# Patient Record
Sex: Female | Born: 1975 | Race: Black or African American | Hispanic: No | Marital: Single | Smoking: Never smoker
Health system: Southern US, Community
[De-identification: ages and names within clinical notes are randomized; demographics above are authoritative.]

## PROBLEM LIST (undated history)

## (undated) DIAGNOSIS — J45909 Unspecified asthma, uncomplicated: Secondary | ICD-10-CM

## (undated) DIAGNOSIS — E119 Type 2 diabetes mellitus without complications: Secondary | ICD-10-CM

## (undated) HISTORY — PX: GASTRIC BYPASS: SHX52

---

## 2007-02-10 HISTORY — PX: GASTRIC BYPASS: SHX52

## 2019-10-17 ENCOUNTER — Emergency Department (HOSPITAL_COMMUNITY): Payer: Medicaid - Out of State

## 2019-10-17 ENCOUNTER — Inpatient Hospital Stay (HOSPITAL_COMMUNITY)
Admission: EM | Admit: 2019-10-17 | Discharge: 2019-10-29 | DRG: 177 | Disposition: A | Discharge status: Home / Self Care | Payer: Medicaid - Out of State | Attending: Internal Medicine | Admitting: Internal Medicine

## 2019-10-17 ENCOUNTER — Encounter (HOSPITAL_COMMUNITY): Payer: Self-pay

## 2019-10-17 DIAGNOSIS — D259 Leiomyoma of uterus, unspecified: Secondary | ICD-10-CM | POA: Diagnosis present

## 2019-10-17 DIAGNOSIS — R52 Pain, unspecified: Secondary | ICD-10-CM

## 2019-10-17 DIAGNOSIS — D62 Acute posthemorrhagic anemia: Secondary | ICD-10-CM | POA: Diagnosis present

## 2019-10-17 DIAGNOSIS — J1282 Pneumonia due to coronavirus disease 2019: Secondary | ICD-10-CM | POA: Diagnosis present

## 2019-10-17 DIAGNOSIS — R6510 Systemic inflammatory response syndrome (SIRS) of non-infectious origin without acute organ dysfunction: Secondary | ICD-10-CM | POA: Diagnosis present

## 2019-10-17 DIAGNOSIS — L0291 Cutaneous abscess, unspecified: Secondary | ICD-10-CM

## 2019-10-17 DIAGNOSIS — Z9114 Patient's other noncompliance with medication regimen: Secondary | ICD-10-CM

## 2019-10-17 DIAGNOSIS — U071 COVID-19: Secondary | ICD-10-CM

## 2019-10-17 DIAGNOSIS — K255 Chronic or unspecified gastric ulcer with perforation: Secondary | ICD-10-CM

## 2019-10-17 DIAGNOSIS — D649 Anemia, unspecified: Secondary | ICD-10-CM | POA: Diagnosis present

## 2019-10-17 DIAGNOSIS — E872 Acidosis: Secondary | ICD-10-CM | POA: Diagnosis present

## 2019-10-17 DIAGNOSIS — K286 Chronic or unspecified gastrojejunal ulcer with both hemorrhage and perforation: Secondary | ICD-10-CM | POA: Diagnosis present

## 2019-10-17 DIAGNOSIS — E1165 Type 2 diabetes mellitus with hyperglycemia: Secondary | ICD-10-CM | POA: Diagnosis not present

## 2019-10-17 DIAGNOSIS — Z9884 Bariatric surgery status: Secondary | ICD-10-CM

## 2019-10-17 DIAGNOSIS — Y832 Surgical operation with anastomosis, bypass or graft as the cause of abnormal reaction of the patient, or of later complication, without mention of misadventure at the time of the procedure: Secondary | ICD-10-CM | POA: Diagnosis present

## 2019-10-17 DIAGNOSIS — K922 Gastrointestinal hemorrhage, unspecified: Secondary | ICD-10-CM

## 2019-10-17 DIAGNOSIS — F1010 Alcohol abuse, uncomplicated: Secondary | ICD-10-CM | POA: Diagnosis present

## 2019-10-17 DIAGNOSIS — R188 Other ascites: Secondary | ICD-10-CM | POA: Diagnosis present

## 2019-10-17 DIAGNOSIS — R739 Hyperglycemia, unspecified: Secondary | ICD-10-CM | POA: Diagnosis not present

## 2019-10-17 DIAGNOSIS — J90 Pleural effusion, not elsewhere classified: Secondary | ICD-10-CM | POA: Diagnosis present

## 2019-10-17 DIAGNOSIS — N179 Acute kidney failure, unspecified: Secondary | ICD-10-CM | POA: Diagnosis not present

## 2019-10-17 DIAGNOSIS — T8143XA Infection following a procedure, organ and space surgical site, initial encounter: Secondary | ICD-10-CM | POA: Diagnosis present

## 2019-10-17 DIAGNOSIS — R198 Other specified symptoms and signs involving the digestive system and abdomen: Secondary | ICD-10-CM | POA: Diagnosis present

## 2019-10-17 LAB — POC OCCULT BLOOD, ED: Fecal Occult Bld: POSITIVE — AB

## 2019-10-17 LAB — SARS CORONAVIRUS 2 BY RT PCR (HOSPITAL ORDER, PERFORMED IN ~~LOC~~ HOSPITAL LAB): SARS Coronavirus 2: POSITIVE — AB

## 2019-10-17 LAB — COMPREHENSIVE METABOLIC PANEL
ALT: 10 U/L (ref 0–44)
AST: 28 U/L (ref 15–41)
Albumin: 3.9 g/dL (ref 3.5–5.0)
Alkaline Phosphatase: 37 U/L — ABNORMAL LOW (ref 38–126)
Anion gap: 19 — ABNORMAL HIGH (ref 5–15)
BUN: 16 mg/dL (ref 6–20)
CO2: 18 mmol/L — ABNORMAL LOW (ref 22–32)
Calcium: 9.1 mg/dL (ref 8.9–10.3)
Chloride: 99 mmol/L (ref 98–111)
Creatinine, Ser: 0.69 mg/dL (ref 0.44–1.00)
GFR calc Af Amer: >60 mL/min (ref >60)
GFR calc non Af Amer: >60 mL/min (ref >60)
Glucose, Bld: 118 mg/dL — ABNORMAL HIGH (ref 70–99)
Potassium: 4.4 mmol/L (ref 3.5–5.1)
Sodium: 136 mmol/L (ref 135–145)
Total Bilirubin: 0.7 mg/dL (ref 0.3–1.2)
Total Protein: 9.2 g/dL — ABNORMAL HIGH (ref 6.5–8.1)

## 2019-10-17 LAB — CBC
HCT: 25.6 % — ABNORMAL LOW (ref 36.0–46.0)
Hemoglobin: 8.2 g/dL — ABNORMAL LOW (ref 12.0–15.0)
MCH: 24.2 pg — ABNORMAL LOW (ref 26.0–34.0)
MCHC: 32 g/dL (ref 30.0–36.0)
MCV: 75.5 fL — ABNORMAL LOW (ref 80.0–100.0)
Platelets: 949 10*3/uL (ref 150–400)
RBC: 3.39 MIL/uL — ABNORMAL LOW (ref 3.87–5.11)
RDW: 33 % — ABNORMAL HIGH (ref 11.5–15.5)
WBC: 12.2 10*3/uL — ABNORMAL HIGH (ref 4.0–10.5)
nRBC: 0 % (ref 0.0–0.2)

## 2019-10-17 LAB — LIPASE, BLOOD: Lipase: 20 U/L (ref 11–51)

## 2019-10-17 LAB — I-STAT BETA HCG BLOOD, ED (MC, WL, AP ONLY): I-stat hCG, quantitative: <5 m[IU]/mL (ref <5)

## 2019-10-17 MED ORDER — SODIUM CHLORIDE 0.9 % IV BOLUS
500.0000 mL | Freq: Once | INTRAVENOUS | Status: AC
  Administered 2019-10-17: 500 mL via INTRAVENOUS

## 2019-10-17 MED ORDER — ACETAMINOPHEN 500 MG PO TABS
1000.0000 mg | ORAL_TABLET | Freq: Once | ORAL | Status: AC
  Administered 2019-10-17: 1000 mg via ORAL
  Filled 2019-10-17: qty 2

## 2019-10-17 MED ORDER — SODIUM CHLORIDE 0.9 % IV SOLN
8.0000 mg/h | INTRAVENOUS | Status: DC
  Administered 2019-10-18 – 2019-10-20 (×6): 8 mg/h via INTRAVENOUS
  Filled 2019-10-17 (×9): qty 80

## 2019-10-17 MED ORDER — PIPERACILLIN-TAZOBACTAM 3.375 G IVPB 30 MIN
3.3750 g | Freq: Once | INTRAVENOUS | Status: AC
  Administered 2019-10-18: 3.375 g via INTRAVENOUS
  Filled 2019-10-17: qty 50

## 2019-10-17 MED ORDER — ONDANSETRON HCL 4 MG/2ML IJ SOLN
4.0000 mg | Freq: Once | INTRAMUSCULAR | Status: AC
  Administered 2019-10-17: 4 mg via INTRAVENOUS
  Filled 2019-10-17: qty 2

## 2019-10-17 MED ORDER — MORPHINE SULFATE (PF) 4 MG/ML IV SOLN
4.0000 mg | Freq: Once | INTRAVENOUS | Status: AC
  Administered 2019-10-17: 4 mg via INTRAVENOUS
  Filled 2019-10-17: qty 1

## 2019-10-17 MED ORDER — IOHEXOL 350 MG/ML SOLN
100.0000 mL | Freq: Once | INTRAVENOUS | Status: AC | PRN
  Administered 2019-10-17: 100 mL via INTRAVENOUS

## 2019-10-17 MED ORDER — FENTANYL CITRATE (PF) 100 MCG/2ML IJ SOLN
50.0000 ug | Freq: Once | INTRAMUSCULAR | Status: AC
  Administered 2019-10-18: 50 ug via INTRAVENOUS
  Filled 2019-10-17: qty 2

## 2019-10-17 MED ORDER — PANTOPRAZOLE SODIUM 40 MG IV SOLR
40.0000 mg | Freq: Once | INTRAVENOUS | Status: AC
  Administered 2019-10-17: 40 mg via INTRAVENOUS
  Filled 2019-10-17: qty 40

## 2019-10-17 NOTE — ED Triage Notes (Signed)
Pt arrived via [facility] from home.  Patient thinks she has covid and has no test  C/o diarrhea, abdominal pain, and generalized weakness.   95-98% RA BP-119/81 P-78 CBG-132 98.7 temp  A/Ox4

## 2019-10-17 NOTE — ED Notes (Signed)
Sister told screener that patient has covid.

## 2019-10-17 NOTE — ED Provider Notes (Signed)
11:35 PM Care assumed from [place], PA-C at shift change.  In short, patient is a 44 y/o female with hx of anemia, PUD (noncompliant with meds) and gastric bypass (2009 in [place]) who presents for evaluation of chest pain and SOB. Known COVID exposure 1 week ago; unvaccinated. Initially had generalized body aches, subjective fevers. Started with worsening CP, SOB, abdominal pain today. Associated diarrhea x 3, last BM c/w melena. Hgb 8.2 but without known baseline and not taking her supplements for her anemia. Has been hemodynamically stable in the department since arrival.  Work up c/w COVID PNA, SIRS criteria. Pending CTAP at shift change given concern for intra-abdominal free air on CXR. Impression of CT scan as follows:  IMPRESSION:  1. Multiple foci of free air throughout the abdomen consistent with  perforated viscus. Site of perforation is not definitively  delineated, however there is minimal wall thickening of the proximal  Roux limb. In the setting of gastric bypass, perforated marginal  ulcer is considered. There is no other site of bowel inflammation.   Consultation placed to hospitalist as well as general surgery given findings of perforated viscus. Will continue on IV protonix gtt and start on IV Zosyn. Secure chat was initiated by Etheleen Nicks to GI provider, Dr. Collene Mares, for AM consultation regarding melena prior to knowledge of bowel perforation.   12:05 AM Repeat page placed to general surgery. Spoke with Dr. Marlowe Sax of [facility]; requests call back after completion of general surgery consult.  12:20 AM Call placed to OR to see if [facility] MD occupied in an emergent procedure. Told by OR front desk that there were no active general surgery cases.  12:30 AM Repeat page placed to general surgery.  12:47 AM Call placed to [facility] answering service who will attempt to page surgeon on call.  1:04 AM Consult placed to [facility] surgeon at [facility] in attempt to make contact with service  regarding recommendations and disposition.  1:25 AM Call placed back to [facility] answering service for repeat page.  1:55 AM Spoke with MD Rosendo Gros of [facility] regarding patient case. Will review chart and assess in the department as soon as possible to assist in management.  2:12 AM Dr. Rosendo Gros en route to ED to assess patient.  2:40 AM Surgeon at bedside.  2:57 AM Per MD Rosendo Gros, [facility] to continue in consult. Will proceed with serial abdominal exams and reassess in AM. Advise continuation of Protonix gtt, IV Zosyn, pain meds PRN. Remain NPO. Admission to [place] to [facility] service.  3:30 AM Dr. Marlowe Sax to admit.   Vitals:   10/18/19 0000 10/18/19 0045 10/18/19 0100 10/18/19 0130  BP: 121/76 123/79 115/71 112/67  Pulse: 83 71 75 70  Resp: 18 18 18  (!) 22  Temp:  99.1 F (37.3 C)    TempSrc:  Oral    SpO2: 96% 97% 96% 96%    .Critical Care Performed by: Antonietta Breach, PA-C Authorized by: Antonietta Breach, PA-C   Critical care provider statement:    Critical care time (minutes):  45   Critical care was necessary to treat or prevent imminent or life-threatening deterioration of the following conditions: perforated viscus; UGI bleed.   Critical care was time spent personally by me on the following activities:  Discussions with consultants, evaluation of patient's response to treatment, examination of patient, ordering and performing treatments and interventions, ordering and review of laboratory studies, ordering and review of radiographic studies, pulse oximetry, re-evaluation of patient's condition, obtaining history  from patient or surrogate and review of old charts      Antonietta Breach, [place] 10/18/19 0331    Orpah Greek, MD 10/20/19 [phone]

## 2019-10-17 NOTE — ED Notes (Signed)
Date and time results received: 10/17/19 1953 (use smartphrase ".now" to insert current time)  Test: SARS COVID; Platelets Critical Value: Positive; 949  Name of Provider Notified: Dr. Hillard Danker  Orders Received? Or Actions Taken?

## 2019-10-17 NOTE — ED Provider Notes (Signed)
[facility] DEPT Provider Note   CSN: [id number] Arrival date & time: 10/17/19  1344     History Chief Complaint  Patient presents with  . Abdominal Pain  . Generalized Body Aches  . COVID SX    Kendra [name] is a 44 y.o. female who presents for evaluation of generalized body aches, chest pain, SOB, subjective fevers, abdominal pain that has been ongoing for about a week. She states that her sister had COVID and was at her house. She started having a few symptoms about a week ago but did not get tested. Today, she felt like her CP and SOB was getting worse. She also reports that she started having some additional upper abdominal pain. She has not had any vomiting but has had some nausea. She states she also have 3 episodes of diarrhea. Her last episode was dark black. No BRB. She did not get vaccinated. She does endorse smoking marijuana but nothing recently. She has a history of anemia and is supposed to be on medications but states she has not been taking them. She is not on blood thinners. She also reports she has a history of ulcers and is not currently taking any meds.   The history is provided by the patient.       History reviewed. No pertinent past medical history.  There are no problems to display for this patient.   History reviewed. No pertinent surgical history.   OB History   No obstetric history on file.     No family history on file.  Social History   Tobacco Use  . Smoking status: Not on file  Substance Use Topics  . Alcohol use: Not on file  . Drug use: Not on file    Home Medications Prior to Admission medications   Not on File    Allergies    Patient has no known allergies.  Review of Systems   Review of Systems  Constitutional: Positive for activity change, appetite change, fatigue and fever.  Respiratory: Positive for cough and shortness of breath.   Cardiovascular: Positive for chest pain.    Gastrointestinal: Positive for abdominal pain, diarrhea and nausea. Negative for vomiting.  Genitourinary: Negative for dysuria and hematuria.  Neurological: Negative for headaches.  All other systems reviewed and are negative.   Physical Exam Updated Vital Signs BP 124/71   Pulse 88   Temp (!) 100.6 F (38.1 C) (Oral)   Resp (!) 30   LMP 10/10/2019   SpO2 100%   Physical Exam Vitals and nursing note reviewed. Exam conducted with a chaperone present.  Constitutional:      Appearance: Normal appearance. She is well-developed. She is ill-appearing.     Comments: Appears uncomfortable  HENT:     Head: Normocephalic and atraumatic.  Eyes:     General: Lids are normal.     Conjunctiva/sclera: Conjunctivae normal.     Pupils: Pupils are equal, round, and reactive to light.  Cardiovascular:     Rate and Rhythm: Normal rate and regular rhythm.     Pulses: Normal pulses.     Heart sounds: Normal heart sounds. No murmur heard.  No friction rub. No gallop.   Pulmonary:     Breath sounds: Normal breath sounds.     Comments: Tachypnea noted. No evidence of respiratory distress.  Abdominal:     Palpations: Abdomen is soft. Abdomen is not rigid.     Tenderness: There is abdominal tenderness in the right upper  quadrant, epigastric area and left upper quadrant. There is guarding.     Comments: Abdomen is soft, non-distended. Tenderness noted to the upper abdomen. Voluntary guarding noted. No rigidity.   Genitourinary:    Rectum: Guaiac result positive.     Comments: The exam was performed with a chaperone present. Digital Rectal Exam reveals sphincter with good tone. No external hemorrhoids. No masses or fissures. Stool is dark in color. Guaiac positive.  Musculoskeletal:        General: Normal range of motion.     Cervical back: Full passive range of motion without pain.  Skin:    General: Skin is warm and dry.     Capillary Refill: Capillary refill takes less than 2 seconds.   Neurological:     Mental Status: She is alert and oriented to person, place, and time.  Psychiatric:        Speech: Speech normal.     ED Results / Procedures / Treatments   Labs (all labs ordered are listed, but only abnormal results are displayed) Labs Reviewed  SARS CORONAVIRUS 2 BY RT PCR ([facility]) - Abnormal; Notable for the following components:      Result Value   SARS Coronavirus 2 POSITIVE (*)    All other components within normal limits  COMPREHENSIVE METABOLIC PANEL - Abnormal; Notable for the following components:   CO2 18 (*)    Glucose, Bld 118 (*)    Total Protein 9.2 (*)    Alkaline Phosphatase 37 (*)    Anion gap 19 (*)    All other components within normal limits  CBC - Abnormal; Notable for the following components:   WBC 12.2 (*)    RBC 3.39 (*)    Hemoglobin 8.2 (*)    HCT 25.6 (*)    MCV 75.5 (*)    MCH 24.2 (*)    RDW 33.0 (*)    Platelets 949 (*)    All other components within normal limits  POC OCCULT BLOOD, ED - Abnormal; Notable for the following components:   Fecal Occult Bld POSITIVE (*)    All other components within normal limits  LIPASE, BLOOD  URINALYSIS, ROUTINE W REFLEX MICROSCOPIC  PATHOLOGIST SMEAR REVIEW  I-STAT BETA HCG BLOOD, ED ([facility], [facility], [facility] ONLY)  TROPONIN I (HIGH SENSITIVITY)  TROPONIN I (HIGH SENSITIVITY)    EKG EKG Interpretation  Date/Time:  Tuesday October 17 2019 17:29:54 EDT Ventricular Rate:  88 PR Interval:    QRS Duration: 67 QT Interval:  358 QTC Calculation: 434 R Axis:   -23 Text Interpretation: Sinus rhythm Borderline left axis deviation Borderline T abnormalities, diffuse leads 12 Lead; Mason-Likar No old tracing to compare Confirmed by Dorie Rank [phone]) on 10/17/2019 6:32:22 PM   Radiology DG Chest 2 View  Result Date: 10/17/2019 CLINICAL DATA:  Epigastric pain EXAM: CHEST - 2 VIEW COMPARISON:  None. FINDINGS: Frontal and lateral views of the chest  demonstrate an unremarkable cardiac silhouette. Multifocal bibasilar ground-glass airspace disease could reflect atypical infection such as COVID-19. No effusion or pneumothorax. Surgical clips are seen at the gastroesophageal junction. Lucency beneath the left hemidiaphragm, with gas fluid level noted on lateral view, could reflect a distended stomach. Please correlate for any peritoneal signs that would suggest free gas. If further evaluation is desired, decubitus views of the abdomen or CT of the abdomen could be considered. IMPRESSION: 1. Bibasilar ground-glass airspace disease, which could reflect multifocal pneumonia versus edema. Pattern is  consistent with COVID-19. 2. Nonspecific lucency left upper quadrant, which may reflect a distended gastric fundus. If there is concern for free intraperitoneal gas, decubitus radiographs or CT could be considered. Critical Value/emergent results were called by telephone at the time of interpretation on 10/17/2019 at 6:36 pm to provider Dr. Tomi Bamberger, who verbally acknowledged these results. Electronically Signed   By: Randa Ngo M.D.   On: 10/17/2019 18:36    Procedures Procedures (including critical care time)  Medications Ordered in ED Medications  acetaminophen (TYLENOL) tablet 1,000 mg (1,000 mg Oral Given 10/17/19 2227)  ondansetron (ZOFRAN) injection 4 mg (4 mg Intravenous Given 10/17/19 2228)  morphine 4 MG/ML injection 4 mg (4 mg Intravenous Given 10/17/19 2228)  sodium chloride 0.9 % bolus 500 mL (500 mLs Intravenous New Bag/Given 10/17/19 2228)  pantoprazole (PROTONIX) injection 40 mg (40 mg Intravenous Given 10/17/19 2227)  iohexol (OMNIPAQUE) 350 MG/ML injection 100 mL (100 mLs Intravenous Contrast Given 10/17/19 2312)    ED Course  I have reviewed the triage vital signs and the nursing notes.  Pertinent labs & imaging results that were available during my care of the patient were reviewed by me and considered in my medical decision making (see chart for  details).    MDM Rules/Calculators/A&P                          44 y.o. F who presents for evaluation of fevers, body aches, chest pain, difficulty breathing, abdominal pain.  Reports sister was recently positive for Covid.  She did not get vaccinated.  On initially arrival, she is febrile 100.6.  Vitals otherwise stable.  On exam, she is tender to the upper abdomen.  No evidence of respiratory distress.  Patient also reports she has had an episode of dark stools.  She does have history of ulcers and states she is post p.o. medication but has not been taking it.  She also reports she has had history of anemia but states she has not been taking her supplements.  On exam, she does have tenderness into the upper abdomen.  Concern for COVID-19 infection versus intra-abdominal pathology versus GI bleed.  Labs ordered at triage.  Fecal occult is positive.  Patient started on Protonix.  Covid positive.  CBC shows leukocytosis 12.2.  Hemoglobin is 8.2.  Platelets are 9 or 49.  CMP shows normal BUN and creatinine.  Anion gap is 19.  Beta negative.  Chest x-ray shows groundglass opacities. There is also a finding noted of a nonspecific lucency in the left upper quadrant which could reflect distended gastric fundus but if there is intraperitoneal gas, should have a CT scan. Patient does exhibit abdominal tenderness. Concerned for possible perforated ulcer. Will obtain CT scan for evaluation of possible perforation.  At this time, patient is anemic with dark stools and fecal occult positive.  Concern for possible GI bleed. Also concerned about possible perforated ulcer. Patient started Protonix drip.  I do not have any baseline hemoglobin for her.  At this time, I feel that it is reasonable for her to have admission with trending her hemoglobins.  Dr. Collene Mares (GI) has been secure messaged and added to treatment team.   Patient signed out to [facility], PA-C pending CT scan.   Kendra [name] was evaluated in  Emergency Department on 10/17/2019 for the symptoms described in the history of present illness. She was evaluated in the context of the global COVID-19 pandemic, which necessitated consideration  that the patient might be at risk for infection with the SARS-CoV-2 virus that causes COVID-19. Institutional protocols and algorithms that pertain to the evaluation of patients at risk for COVID-19 are in a state of rapid change based on information released by regulatory bodies including the CDC and federal and state organizations. These policies and algorithms were followed during the patient's care in the ED.  Portions of this note were generated with [employer]. Dictation errors may occur despite best attempts at proofreading.   Final Clinical Impression(s) / ED Diagnoses Final diagnoses:  [id number]    Rx / DC Orders ED Discharge Orders    None       Volanda Napoleon, PA-C 10/18/19 0001    Dorie Rank, MD 10/19/19 [phone]

## 2019-10-18 ENCOUNTER — Encounter (HOSPITAL_COMMUNITY): Payer: Self-pay | Admitting: Internal Medicine

## 2019-10-18 ENCOUNTER — Inpatient Hospital Stay (HOSPITAL_COMMUNITY): Payer: Medicaid - Out of State

## 2019-10-18 DIAGNOSIS — D649 Anemia, unspecified: Secondary | ICD-10-CM | POA: Diagnosis present

## 2019-10-18 DIAGNOSIS — Z9114 Patient's other noncompliance with medication regimen: Secondary | ICD-10-CM | POA: Diagnosis not present

## 2019-10-18 DIAGNOSIS — J90 Pleural effusion, not elsewhere classified: Secondary | ICD-10-CM | POA: Diagnosis present

## 2019-10-18 DIAGNOSIS — D62 Acute posthemorrhagic anemia: Secondary | ICD-10-CM | POA: Diagnosis present

## 2019-10-18 DIAGNOSIS — E1165 Type 2 diabetes mellitus with hyperglycemia: Secondary | ICD-10-CM | POA: Diagnosis not present

## 2019-10-18 DIAGNOSIS — T8143XA Infection following a procedure, organ and space surgical site, initial encounter: Secondary | ICD-10-CM | POA: Diagnosis present

## 2019-10-18 DIAGNOSIS — R6510 Systemic inflammatory response syndrome (SIRS) of non-infectious origin without acute organ dysfunction: Secondary | ICD-10-CM | POA: Diagnosis not present

## 2019-10-18 DIAGNOSIS — N179 Acute kidney failure, unspecified: Secondary | ICD-10-CM | POA: Diagnosis present

## 2019-10-18 DIAGNOSIS — U071 COVID-19: Secondary | ICD-10-CM | POA: Diagnosis present

## 2019-10-18 DIAGNOSIS — K286 Chronic or unspecified gastrojejunal ulcer with both hemorrhage and perforation: Secondary | ICD-10-CM | POA: Diagnosis present

## 2019-10-18 DIAGNOSIS — Z9884 Bariatric surgery status: Secondary | ICD-10-CM

## 2019-10-18 DIAGNOSIS — Y832 Surgical operation with anastomosis, bypass or graft as the cause of abnormal reaction of the patient, or of later complication, without mention of misadventure at the time of the procedure: Secondary | ICD-10-CM | POA: Diagnosis present

## 2019-10-18 DIAGNOSIS — E872 Acidosis: Secondary | ICD-10-CM | POA: Diagnosis present

## 2019-10-18 DIAGNOSIS — L0291 Cutaneous abscess, unspecified: Secondary | ICD-10-CM | POA: Diagnosis not present

## 2019-10-18 DIAGNOSIS — R198 Other specified symptoms and signs involving the digestive system and abdomen: Secondary | ICD-10-CM | POA: Diagnosis present

## 2019-10-18 DIAGNOSIS — R188 Other ascites: Secondary | ICD-10-CM | POA: Diagnosis present

## 2019-10-18 DIAGNOSIS — R0602 Shortness of breath: Secondary | ICD-10-CM | POA: Diagnosis present

## 2019-10-18 DIAGNOSIS — J1282 Pneumonia due to coronavirus disease 2019: Secondary | ICD-10-CM | POA: Diagnosis present

## 2019-10-18 DIAGNOSIS — F1010 Alcohol abuse, uncomplicated: Secondary | ICD-10-CM | POA: Diagnosis present

## 2019-10-18 DIAGNOSIS — R739 Hyperglycemia, unspecified: Secondary | ICD-10-CM | POA: Diagnosis not present

## 2019-10-18 DIAGNOSIS — D259 Leiomyoma of uterus, unspecified: Secondary | ICD-10-CM | POA: Diagnosis present

## 2019-10-18 LAB — LACTIC ACID, PLASMA
Lactic Acid, Venous: 1.1 mmol/L (ref 0.5–1.9)
Lactic Acid, Venous: 0.9 mmol/L (ref 0.5–1.9)

## 2019-10-18 LAB — RETICULOCYTES
Immature Retic Fract: 24.2 % — ABNORMAL HIGH (ref 2.3–15.9)
RBC.: 3.11 MIL/uL — ABNORMAL LOW (ref 3.87–5.11)
Retic Count, Absolute: 32 10*3/uL (ref 19.0–186.0)
Retic Ct Pct: 1 % (ref 0.4–3.1)

## 2019-10-18 LAB — URINALYSIS, ROUTINE W REFLEX MICROSCOPIC
Bilirubin Urine: NEGATIVE
Glucose, UA: NEGATIVE mg/dL
Hgb urine dipstick: NEGATIVE
Ketones, ur: 80 mg/dL — AB
Leukocytes,Ua: NEGATIVE
Nitrite: NEGATIVE
Protein, ur: NEGATIVE mg/dL
Specific Gravity, Urine: >1.046 — ABNORMAL HIGH (ref 1.005–1.030)
pH: 6 (ref 5.0–8.0)

## 2019-10-18 LAB — CBC
HCT: 21.8 % — ABNORMAL LOW (ref 36.0–46.0)
Hemoglobin: 6.2 g/dL — CL (ref 12.0–15.0)
MCH: 19.1 pg — ABNORMAL LOW (ref 26.0–34.0)
MCHC: 28.4 g/dL — ABNORMAL LOW (ref 30.0–36.0)
MCV: 67.1 fL — ABNORMAL LOW (ref 80.0–100.0)
Platelets: 721 10*3/uL — ABNORMAL HIGH (ref 150–400)
RBC: 3.25 MIL/uL — ABNORMAL LOW (ref 3.87–5.11)
RDW: 28.3 % — ABNORMAL HIGH (ref 11.5–15.5)
WBC: 13.6 10*3/uL — ABNORMAL HIGH (ref 4.0–10.5)
nRBC: 0 % (ref 0.0–0.2)

## 2019-10-18 LAB — VITAMIN B12
Vitamin B-12: 216 pg/mL (ref 180–914)
Vitamin B-12: 209 pg/mL (ref 180–914)

## 2019-10-18 LAB — HIV ANTIBODY (ROUTINE TESTING W REFLEX): HIV Screen 4th Generation wRfx: NONREACTIVE

## 2019-10-18 LAB — PROCALCITONIN: Procalcitonin: 2.13 ng/mL

## 2019-10-18 LAB — IRON AND TIBC
Iron: 17 ug/dL — ABNORMAL LOW (ref 28–170)
Iron: 10 ug/dL — ABNORMAL LOW (ref 28–170)
Saturation Ratios: 5 % — ABNORMAL LOW (ref 10.4–31.8)
Saturation Ratios: 3 % — ABNORMAL LOW (ref 10.4–31.8)
TIBC: 369 ug/dL (ref 250–450)
TIBC: 340 ug/dL (ref 250–450)
UIBC: 352 ug/dL
UIBC: 330 ug/dL

## 2019-10-18 LAB — FERRITIN
Ferritin: 17 ng/mL (ref 11–307)
Ferritin: 17 ng/mL (ref 11–307)

## 2019-10-18 LAB — FOLATE
Folate: 10.7 ng/mL (ref >5.9)
Folate: 13.5 ng/mL (ref >5.9)

## 2019-10-18 LAB — FIBRINOGEN: Fibrinogen: 743 mg/dL — ABNORMAL HIGH (ref 210–475)

## 2019-10-18 LAB — PROTIME-INR
INR: 1.4 — ABNORMAL HIGH (ref 0.8–1.2)
Prothrombin Time: 16.5 seconds — ABNORMAL HIGH (ref 11.4–15.2)

## 2019-10-18 LAB — LACTATE DEHYDROGENASE: LDH: 205 U/L — ABNORMAL HIGH (ref 98–192)

## 2019-10-18 LAB — ABO/RH
ABO/RH(D): B POS
ABO/RH(D): B POS

## 2019-10-18 LAB — TROPONIN I (HIGH SENSITIVITY): Troponin I (High Sensitivity): 3 ng/L (ref <18)

## 2019-10-18 LAB — C-REACTIVE PROTEIN
CRP: 20.6 mg/dL — ABNORMAL HIGH (ref <1.0)
CRP: 21.5 mg/dL — ABNORMAL HIGH (ref <1.0)

## 2019-10-18 LAB — D-DIMER, QUANTITATIVE: D-Dimer, Quant: 10.03 ug/mL-FEU — ABNORMAL HIGH (ref 0.00–0.50)

## 2019-10-18 LAB — PATHOLOGIST SMEAR REVIEW

## 2019-10-18 LAB — HEMOGLOBIN AND HEMATOCRIT, BLOOD
HCT: 31.3 % — ABNORMAL LOW (ref 36.0–46.0)
Hemoglobin: 9.2 g/dL — ABNORMAL LOW (ref 12.0–15.0)

## 2019-10-18 LAB — HIV4GL SAVE TUBE

## 2019-10-18 LAB — PREPARE RBC (CROSSMATCH)

## 2019-10-18 MED ORDER — SODIUM CHLORIDE 0.9 % IV SOLN
100.0000 mg | Freq: Every day | INTRAVENOUS | Status: AC
  Administered 2019-10-19 – 2019-10-22 (×4): 100 mg via INTRAVENOUS
  Filled 2019-10-18 (×4): qty 20

## 2019-10-18 MED ORDER — ACETAMINOPHEN 325 MG PO TABS
650.0000 mg | ORAL_TABLET | Freq: Four times a day (QID) | ORAL | Status: DC | PRN
  Administered 2019-10-22 – 2019-10-26 (×2): 650 mg via ORAL
  Filled 2019-10-18 (×4): qty 2

## 2019-10-18 MED ORDER — SODIUM CHLORIDE 0.9% IV SOLUTION: Freq: Once | INTRAVENOUS | Status: DC

## 2019-10-18 MED ORDER — MORPHINE SULFATE (PF) 2 MG/ML IV SOLN
1.0000 mg | INTRAVENOUS | Status: DC | PRN
  Administered 2019-10-18: 1 mg via INTRAVENOUS
  Filled 2019-10-18: qty 1

## 2019-10-18 MED ORDER — PIPERACILLIN-TAZOBACTAM 3.375 G IVPB
3.3750 g | Freq: Three times a day (TID) | INTRAVENOUS | Status: DC
  Administered 2019-10-18 – 2019-10-27 (×27): 3.375 g via INTRAVENOUS
  Filled 2019-10-18 (×29): qty 50

## 2019-10-18 MED ORDER — HYDROMORPHONE HCL 1 MG/ML IJ SOLN
0.5000 mg | INTRAMUSCULAR | Status: DC | PRN
  Administered 2019-10-18: 2 mg via INTRAVENOUS
  Administered 2019-10-18: 1 mg via INTRAVENOUS
  Administered 2019-10-18: 2 mg via INTRAVENOUS
  Administered 2019-10-18 – 2019-10-19 (×6): 1 mg via INTRAVENOUS
  Administered 2019-10-20: 0.5 mg via INTRAVENOUS
  Administered 2019-10-20 (×2): 1 mg via INTRAVENOUS
  Administered 2019-10-20 (×2): 0.5 mg via INTRAVENOUS
  Administered 2019-10-20 – 2019-10-22 (×10): 1 mg via INTRAVENOUS
  Filled 2019-10-18 (×15): qty 1
  Filled 2019-10-18 (×2): qty 2
  Filled 2019-10-18 (×5): qty 1
  Filled 2019-10-18: qty 2
  Filled 2019-10-18 (×4): qty 1

## 2019-10-18 MED ORDER — SODIUM CHLORIDE 0.9 % IV SOLN: INTRAVENOUS | Status: AC

## 2019-10-18 MED ORDER — SODIUM CHLORIDE 0.9% IV SOLUTION: Freq: Once | INTRAVENOUS | Status: AC

## 2019-10-18 MED ORDER — ALBUTEROL SULFATE HFA 108 (90 BASE) MCG/ACT IN AERS: 2.0000 | INHALATION_SPRAY | Freq: Four times a day (QID) | RESPIRATORY_TRACT | Status: DC | PRN

## 2019-10-18 MED ORDER — SODIUM CHLORIDE 0.9 % IV SOLN
200.0000 mg | Freq: Once | INTRAVENOUS | Status: AC
  Administered 2019-10-18: 200 mg via INTRAVENOUS
  Filled 2019-10-18: qty 200

## 2019-10-18 MED ORDER — ONDANSETRON HCL 4 MG/2ML IJ SOLN
4.0000 mg | Freq: Four times a day (QID) | INTRAMUSCULAR | Status: DC | PRN
  Administered 2019-10-19 – 2019-10-29 (×6): 4 mg via INTRAVENOUS
  Filled 2019-10-18 (×7): qty 2

## 2019-10-18 MED ORDER — KCL IN DEXTROSE-NACL 40-5-0.9 MEQ/L-%-% IV SOLN
INTRAVENOUS | Status: DC
  Filled 2019-10-18: qty 1000

## 2019-10-18 NOTE — Consult Note (Signed)
Reason for Consult: Abdominal pain Referring Physician: Dr. Thom Chimes Kendra Kendra [name] is an 44 y.o. female.  HPI: Patient is a 44 year old female, with a history of gastric bypass in 2009.  Patient states that she began having abdominal pain at 10 AM.  She states that thereafter she had a bout of melena.  She states that the pain thereafter continued.  Patient states she not had pain like this previous.  She does state that she has had some coughing, chest pain and some difficulty breathing initially.   Patient presented to the ER this morning.  She underwent work-up per EDP.  CT scan did show perforated viscus, and free air.  Patient had leukocytosis.  Of note Patient did test positive for Covid.  Patient does state that she did have a history of ulcers post gastric bypass however has not had any PPIs, and does not take any vitamins post procedure.  General surgery was consulted for further evaluation and management.  History reviewed. No pertinent past medical history.  History reviewed. No pertinent surgical history.  No family history on file.  Social History:  has no history on file for tobacco use, alcohol use, and drug use.  Allergies: No Known Allergies  Medications: I have reviewed the patient's current medications.  Results for orders placed or performed during the hospital encounter of 10/17/19 (from the past 48 hour(s))  Lipase, Kendra [name]     Status: None   Collection Time: 10/17/19  5:23 PM  Result Value Ref Range   Lipase 20 11 - 51 U/L    Comment: Performed at [facility], [place] [address], [place], [place] [address]  Comprehensive metabolic panel     Status: Abnormal   Collection Time: 10/17/19  5:23 PM  Result Value Ref Range   Sodium 136 135 - 145 mmol/L   Potassium 4.4 3.5 - 5.1 mmol/L   Chloride 99 98 - 111 mmol/L   CO2 18 (L) 22 - 32 mmol/L   Glucose, Bld 118 (H) 70 - 99 mg/dL    Comment: Glucose reference range applies only to samples taken after  fasting for at least 8 hours.   BUN 16 6 - 20 mg/dL   Creatinine, Ser 0.69 0.44 - 1.00 mg/dL   Calcium 9.1 8.9 - 10.3 mg/dL   Total Protein 9.2 (H) 6.5 - 8.1 g/dL   Albumin 3.9 3.5 - 5.0 g/dL   AST 28 15 - 41 U/L   ALT 10 0 - 44 U/L   Alkaline Phosphatase 37 (L) 38 - 126 U/L   Total Bilirubin 0.7 0.3 - 1.2 mg/dL   GFR calc non Af Amer >60 >60 mL/min   GFR calc Af Amer >60 >60 mL/min   Anion gap 19 (H) 5 - 15    Comment: Performed at [facility], [place] [address] [place], [place] [address]  CBC     Status: Abnormal   Collection Time: 10/17/19  5:23 PM  Result Value Ref Range   WBC 12.2 (H) 4.0 - 10.5 K/uL   RBC 3.39 (L) 3.87 - 5.11 MIL/uL   Hemoglobin 8.2 (L) 12.0 - 15.0 g/dL    Comment: Reticulocyte Hemoglobin testing may be clinically indicated, consider ordering this additional test [id number]    HCT 25.6 (L) 36 - 46 %   MCV 75.5 (L) 80.0 - 100.0 fL   MCH 24.2 (L) 26.0 - 34.0 pg   MCHC 32.0 30.0 - 36.0 g/dL   RDW 33.0 (H) 11.5 -  15.5 %   Platelets 949 (HH) 150 - 400 K/uL    Comment: PLATELET COUNT CONFIRMED BY SMEAR THIS CRITICAL RESULT HAS VERIFIED AND BEEN CALLED TO HODGES,I. RN BY KATHLEEN COHEN ON 09 07 2021 AT 1951, AND HAS BEEN READ BACK. CRITICAL RESULT VERIFIED    nRBC 0.0 0.0 - 0.2 %    Comment: Performed at [facility], [place] [address], [place], [place] [address]  SARS Coronavirus 2 by RT PCR (hospital order, performed in [facility] hospital lab) Nasopharyngeal Nasopharyngeal Swab     Status: Abnormal   Collection Time: 10/17/19  5:26 PM   Specimen: Nasopharyngeal Swab  Result Value Ref Range   SARS Coronavirus 2 POSITIVE (A) NEGATIVE    Comment: RESULT CALLED TO, READ BACK BY AND VERIFIED WITH: I.HODGES AT 1950 ON 10/17/19 BY N.THOMPSON (NOTE) SARS-CoV-2 target nucleic acids are DETECTED  SARS-CoV-2 RNA is generally detectable in upper respiratory specimens  during the acute phase of infection.  Positive results are  indicative  of the presence of the identified virus, but do not rule out bacterial infection or co-infection with other pathogens not detected by the test.  Clinical correlation with patient history and  other diagnostic information is necessary to determine patient infection status.  The expected result is negative.  Fact Sheet for Patients:   StrictlyIdeas.no   Fact Sheet for Healthcare Providers:   BankingDealers.co.za    This test is not yet approved or cleared by the [place] FDA and  has been authorized for detection and/or diagnosis of SARS-CoV-2 by FDA under an Emergency Use Authorization (EUA).  This EUA will remain in effect (meaning t his test can be used) for the duration of  the COVID-19 declaration under Section 564(b)(1) of the Act, 21 U.S.C. section 360-bbb-3(b)(1), unless the authorization is terminated or revoked sooner.  Performed at [facility], [place] [address] [place], [place] [address]   Kendra Kendra [name], ED     Status: None   Collection Time: 10/17/19  5:29 PM  Result Value Ref Range   Kendra hCG, quantitative <5.0 <5 mIU/mL   Comment 3            Comment:   GEST. AGE      CONC.  (mIU/mL)   <=1 WEEK        5 - 50     2 WEEKS       50 - 500     3 WEEKS       100 - 10,000     4 WEEKS     1,000 - 30,000        FEMALE AND NON-PREGNANT FEMALE:     LESS THAN 5 mIU/mL   POC occult Kendra [name], ED Provider will collect     Status: Abnormal   Collection Time: 10/17/19  9:55 PM  Result Value Ref Range   Fecal Occult Bld POSITIVE (A) NEGATIVE  Type and screen     Status: None   Collection Time: 10/17/19 11:29 PM  Result Value Ref Range   ABO/RH(D) B POS    Antibody Screen NEG    Sample Expiration      10/20/2019,2359 Performed at [facility], [place] [address], [place], [place] [address]   Protime-INR     Status: Abnormal   Collection Time: 10/18/19 12:40 AM  Result  Value Ref Range   Prothrombin Time 16.5 (H) 11.4 - 15.2 seconds   INR 1.4 (H) 0.8 -  1.2    Comment: (NOTE) INR goal varies based on device and disease states. Performed at [facility], [place] [address] [place], [place] [address]   Urinalysis, Routine w reflex microscopic Urine, Clean Catch     Status: Abnormal   Collection Time: 10/18/19  1:00 AM  Result Value Ref Range   Color, Urine YELLOW YELLOW   APPearance CLEAR CLEAR   Specific Gravity, Urine >1.046 (H) 1.005 - 1.030   pH 6.0 5.0 - 8.0   Glucose, UA NEGATIVE NEGATIVE mg/dL   Hgb urine dipstick NEGATIVE NEGATIVE   Bilirubin Urine NEGATIVE NEGATIVE   Ketones, ur 80 (A) NEGATIVE mg/dL   Protein, ur NEGATIVE NEGATIVE mg/dL   Nitrite NEGATIVE NEGATIVE   Leukocytes,Ua NEGATIVE NEGATIVE    Comment: Performed at [place] [address] [place], [place] [address]    DG Chest 2 View  Result Date: 10/17/2019 CLINICAL DATA:  Epigastric pain EXAM: CHEST - 2 VIEW COMPARISON:  None. FINDINGS: Frontal and lateral views of the chest demonstrate an unremarkable cardiac silhouette. Multifocal bibasilar ground-glass airspace disease could reflect atypical infection such as COVID-19. No effusion or pneumothorax. Surgical clips are seen at the gastroesophageal junction. Lucency beneath the left hemidiaphragm, with gas fluid level noted on lateral view, could reflect a distended stomach. Please correlate for any peritoneal signs that would suggest free gas. If further evaluation is desired, decubitus views of the abdomen or CT of the abdomen could be considered. IMPRESSION: 1. Bibasilar ground-glass airspace disease, which could reflect multifocal pneumonia versus edema. Pattern is consistent with COVID-19. 2. Nonspecific lucency left upper quadrant, which may reflect a distended gastric fundus. If there is concern for free intraperitoneal gas, decubitus radiographs or CT could be considered. Critical  Value/emergent results were called by telephone at the time of interpretation on 10/17/2019 at 6:36 pm to provider Dr. Tomi Bamberger, who verbally acknowledged these results. Electronically Signed   By: Randa Ngo M.D.   On: 10/17/2019 18:36   CT ABDOMEN PELVIS W CONTRAST  Result Date: 10/17/2019 CLINICAL DATA:  Abdominal pain and diarrhea.  Generalized weakness. EXAM: CT ABDOMEN AND PELVIS WITH CONTRAST TECHNIQUE: Multidetector CT imaging of the abdomen and pelvis was performed using the standard protocol following bolus administration of intravenous contrast. CONTRAST:  185mL OMNIPAQUE IOHEXOL 350 MG/ML SOLN COMPARISON:  Chest radiograph earlier this day. FINDINGS: Lower chest: Peripheral ground-glass and consolidative opacities in both lower lobes. Minimal pleural thickening without effusion. Hepatobiliary: No focal liver abnormality is seen. No gallstones, gallbladder wall thickening, or biliary dilatation. Air tracking about the left lobe of the liver appears to be related to free intra-abdominal air rather than biliary or portal venous gas. Pancreas: No ductal dilatation or inflammation. Spleen: Normal in size without focal abnormality. Adrenals/Urinary Tract: Normal adrenal glands. No hydronephrosis or perinephric edema. Homogeneous renal enhancement with symmetric excretion on delayed phase imaging. Urinary bladder is physiologically distended without wall thickening. Stomach/Bowel: Multiple foci of free air scattered throughout the upper abdomen as well as lower anterior mesentery and lower anterior abdominal cavity. Site of perforation is not definitively delineated. Gastric bypass anatomy. Bowel evaluation is limited in the absence of enteric contrast and paucity of intra-abdominal fat. The excluded gastric remnant is minimally fluid-filled but nondilated. There is minimal wall thickening of the proximal Roux limb. The jejunal anastomosis in this grossly normal. There is no small bowel obstruction or  inflammation. Small bowel loop extends into an umbilical hernia but no evidence of bowel wall thickening  or proximal dilatation. Pelvic bowel loops are fluid-filled but nondilated. Colon is difficult to define. There is liquid stool in the proximal colon. Normal appendix tentatively visualized. There is transverse colonic tortuosity. The majority of the colon is completely decompressed. Vascular/Lymphatic: Normal caliber abdominal aorta. Patent portal vein. No evidence of portal venous or mesenteric gas. No gross abdominopelvic adenopathy. Reproductive: Retroverted uterus with probable posterior fundal fibroid. No evidence of adnexal mass. Other: Multiple foci of free air throughout the abdomen. Inferior seen throughout the mesentery, anteriorly as well as tracking into the upper abdomen. Small amount of free fluid in the pelvis. There may be an organized collection posterior to the uterus measuring 4.9 cm, series 2, image 63. Musculoskeletal: Degenerative disc disease at L5-S1. There are no acute or suspicious osseous abnormalities. IMPRESSION: 1. Multiple foci of free air throughout the abdomen consistent with perforated viscus. Site of perforation is not definitively delineated, however there is minimal wall thickening of the proximal Roux limb. In the setting of gastric bypass, perforated marginal ulcer is considered. There is no other site of bowel inflammation. 2. Small amount of free fluid in the pelvis. There may be an organized collection posterior to the uterus measuring 4.9 cm. 3. Peripheral ground-glass and consolidative opacities in both lower lobes, consistent with COVID pneumonia. Critical Value/emergent results were called by telephone at the time of interpretation on 10/17/2019 at 11:26 pm to Dr Dorie Rank , who verbally acknowledged these results. Electronically Signed   By: Keith Rake M.D.   On: 10/17/2019 23:28    Review of Systems  Constitutional: Negative for chills and fever.  HENT:  Negative for ear discharge, hearing loss and sore throat.   Eyes: Negative for discharge.  Respiratory: Positive for shortness of breath. Negative for cough.   Cardiovascular: Negative for chest pain and leg swelling.  Gastrointestinal: Positive for abdominal pain. Negative for constipation, diarrhea, nausea and vomiting.  Musculoskeletal: Negative for myalgias and neck pain.  Skin: Negative for rash.  Allergic/Immunologic: Negative for environmental allergies.  Neurological: Negative for dizziness and seizures.  Hematological: Does not bruise/bleed easily.  Psychiatric/Behavioral: Negative for suicidal ideas.  All other systems reviewed and are negative.  Kendra [name] pressure 104/65, pulse 68, temperature 99.1 F (37.3 C), temperature source Oral, resp. rate (!) 26, last menstrual period 10/10/2019, SpO2 95 %. Physical Exam Constitutional:      Appearance: She is well-developed.     Comments: Conversant No acute distress  Eyes:     General: Lids are normal. No scleral icterus.    Comments: Pupils are equal round and reactive No lid lag Moist conjunctiva  Neck:     Thyroid: No thyromegaly.     Trachea: No tracheal tenderness.     Comments: No cervical lymphadenopathy Cardiovascular:     Rate and Rhythm: Normal rate and regular rhythm.     Heart sounds: No murmur heard.   Pulmonary:     Effort: Pulmonary effort is normal.     Breath sounds: Normal breath sounds. No wheezing or rales.  Abdominal:     General: Bowel sounds are decreased.     Tenderness: There is generalized abdominal tenderness. There is no guarding or rebound.     Hernia: No hernia is present.  Skin:    General: Skin is warm.     Findings: No rash.     Nails: There is no clubbing.     Comments: Normal skin turgor  Neurological:     Mental Status: She is alert and  oriented to person, place, and time.     Comments: Normal gait and station  Psychiatric:        Judgment: Judgment normal.     Comments:  Appropriate affect     Assessment/Plan: 44 year old female, perforated viscus with history of gastric bypass Covid positive  1.  Would recommend continued n.p.o., IV fluids 2.  I discussed the case with both the patient and the ER physicians.  At this time would recommend continued antibiotics, serial abdominal exams, continuing PPI drip, and serial abdominal exams.  If patient's pain continues she would likely would require ex lap.  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   Ralene Ok 10/18/2019, 3:00 AM

## 2019-10-18 NOTE — Progress Notes (Signed)
PROGRESS NOTE    Kendra [name]  AGT:[id number] DOB: December 18, 1975 DOA: 10/17/2019 PCP: System, Pcp Not In    Brief Narrative:  44 year old lady with history of gastric bypass in 2009, no chronic medical issues, unvaccinated against COVID-19 presented to the ER with abdominal pain and Covid-like symptoms.  She has been having symptoms for about 2 weeks now.  Generalized weakness, body ache cough and shortness of breath.  Patient noted melanotic stool at home and started having epigastric abdominal pain yesterday so came to the ER. In the emergency room, temperature 100.6.  Blood pressure stable.  On room air.  Melanotic stool.  Hemoglobin 8.2.  COVID-19 positive.  Chest x-ray with bibasilar groundglass airspace disease. CT scan of the abdomen with multiple area of free air, organized collection posterior to the uterus of 5 cm.  No baseline labs available. Patient is started on Protonix infusion, surgical consult was done, currently on supportive treatment with IV antibiotic and IV fluid with Protonix infusion.   Assessment & Plan:   Principal Problem:   Perforated viscus Active Problems:   History of gastric bypass   SIRS (systemic inflammatory response syndrome) (HCC)   Pneumonia due to COVID-19 virus   Anemia  Suspected perforated bleeding anastomotic ulcer: Surgery on board, N.p.o., IV fluids, IV Zosyn, adequate pain medications. Transfusion to keep hemoglobin more than 8. Further management as per surgery.  COVID-19 pneumonia: Patient is on room air.  No indication for steroids, will avoid in active bleeding. Patient is on Zosyn for intra-abdominal prophylaxis.  We will continue. Given significant evidence of pneumonia on bilateral lungs, will treat with remdesivir for 5 days. Will need close monitoring and inflammatory markers monitoring. Chest physiotherapy, incentive spirometry. COVID-19 Labs  Recent Labs    10/18/19 0540 10/18/19 0739  DDIMER QUESTIONABLE RESULTS,  RECOMMEND RECOLLECT TO VERIFY 10.03*  FERRITIN 17 17  LDH QUESTIONABLE RESULTS, RECOMMEND RECOLLECT TO VERIFY 205*  CRP 20.6* 21.5*    Lab Results  Component Value Date   SARSCOV2NAA POSITIVE (A) 10/17/2019   SpO2: 97 %  Acute blood loss anemia: Baseline hemoglobin unknown.  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  Presented with hemoglobin 8.2.  Dropped to 6.2. Due to significant illness and ongoing suspected bleeding, will transfuse 2 units of PRBC today.  Patient consented. Continue close monitoring, check after transfusion and recheck levels tomorrow morning.  DVT prophylaxis: SCDs Start: 10/18/19 2248   Code Status: Full code Family Communication: Patient provided sister's number to call, she was not able to pick up the phone. Disposition Plan: Status is: Inpatient  Remains inpatient appropriate because:IV treatments appropriate due to intensity of illness or inability to take PO and Inpatient level of care appropriate due to severity of illness   Dispo: The patient is from: Home              Anticipated d/c is to: Home              Anticipated d/c date is: 3 days              Patient currently is not medically stable to d/c.         Consultants:   Surgery  Procedures:   None  Antimicrobials:  Anti-infectives (From admission, onward)   Start     Dose/Rate Route Frequency Ordered Stop   10/19/19 1000  remdesivir 100 mg in sodium chloride 0.9 % 100 mL IVPB       "Followed by" Linked Group Details  100 mg 200 mL/hr over 30 Minutes Intravenous Daily 10/18/19 0515 10/23/19 0959   10/18/19 0600  remdesivir 200 mg in sodium chloride 0.9% 250 mL IVPB       "Followed by" Linked Group Details   200 mg 580 mL/hr over 30 Minutes Intravenous Once 10/18/19 0515 10/18/19 0633   10/18/19 0600  piperacillin-tazobactam (ZOSYN) IVPB 3.375 g        3.375 g 12.5 mL/hr over 240 Minutes Intravenous Every 8 hours 10/18/19 0520     10/17/19 2330   piperacillin-tazobactam (ZOSYN) IVPB 3.375 g        3.375 g 100 mL/hr over 30 Minutes Intravenous  Once 10/17/19 2329 10/18/19 0125         Subjective: Patient seen and examined.  Has mild abdominal pain.  Chest pain on deep breathing.  Mostly on room air.  Last bowel movement before coming to the hospital yesterday.  Unsure whether she is passing any flatus.  Objective: Vitals:   10/18/19 1218 10/18/19 1445 10/18/19 1449 10/18/19 1558  BP: 111/75 116/79 116/79 116/79  Pulse: 73 75 75 73  Resp: 20  16 16   Temp: 98.5 F (36.9 C) 99 F (37.2 C) 99 F (37.2 C) 99 F (37.2 C)  TempSrc: Oral Oral Oral Oral  SpO2: 97% 97% 97%     Intake/Output Summary (Last 24 hours) at 10/18/2019 1605 Last data filed at 10/18/2019 1558 Gross per 24 hour  Intake 920 ml  Output --  Net 920 ml   There were no vitals filed for this visit.  Examination:  General exam: Appears calm and comfortable  Chronically sick looking.  On room air.  Not in any distress. Respiratory system: Clear to auscultation. Respiratory effort normal.  No added sounds. Cardiovascular system: S1 & S2 heard, RRR. No JVD, murmurs, rubs, gallops or clicks. No pedal edema. Gastrointestinal system: Abdomen soft.  Bowel sounds are present.  There is mild tenderness diffuse with no rigidity or guarding. Central nervous system: Alert and oriented. No focal neurological deficits. Extremities: Symmetric 5 x 5 power. Skin: No rashes, lesions or ulcers Psychiatry: Judgement and insight appear normal. Mood & affect appropriate.     Data Reviewed: I have personally reviewed following labs and imaging studies  CBC: Recent Labs  Lab 10/17/19 1723 10/18/19 0540 10/18/19 0739  WBC 12.2* QUESTIONABLE RESULTS, RECOMMEND RECOLLECT TO VERIFY 13.6*  HGB 8.2* QUESTIONABLE RESULTS, RECOMMEND RECOLLECT TO VERIFY 6.2*  HCT 25.6* QUESTIONABLE RESULTS, RECOMMEND RECOLLECT TO VERIFY 21.8*  MCV 75.5* QUESTIONABLE RESULTS, RECOMMEND RECOLLECT  TO VERIFY 67.1*  PLT 949* QUESTIONABLE RESULTS, RECOMMEND RECOLLECT TO VERIFY 932*   Basic Metabolic Panel: Recent Labs  Lab 10/17/19 1723  NA 136  K 4.4  CL 99  CO2 18*  GLUCOSE 118*  BUN 16  CREATININE 0.69  CALCIUM 9.1   GFR: CrCl cannot be calculated (Unknown ideal weight.). Liver Function Tests: Recent Labs  Lab 10/17/19 1723  AST 28  ALT 10  ALKPHOS 37*  BILITOT 0.7  PROT 9.2*  ALBUMIN 3.9   Recent Labs  Lab 10/17/19 1723  LIPASE 20   No results for input(s): AMMONIA in the last 168 hours. Coagulation Profile: Recent Labs  Lab 10/18/19 0040  INR 1.4*   Cardiac Enzymes: No results for input(s): CKTOTAL, CKMB, CKMBINDEX, TROPONINI in the last 168 hours. BNP (last 3 results) No results for input(s): PROBNP in the last 8760 hours. HbA1C: No results for input(s): HGBA1C in the last 72 hours. CBG: No results  for input(s): GLUCAP in the last 168 hours. Lipid Profile: No results for input(s): CHOL, HDL, LDLCALC, TRIG, CHOLHDL, LDLDIRECT in the last 72 hours. Thyroid Function Tests: No results for input(s): TSH, T4TOTAL, FREET4, T3FREE, THYROIDAB in the last 72 hours. Anemia Panel: Recent Labs    10/18/19 0540 10/18/19 0739  VITAMINB12 216 209  FOLATE 10.7 13.5  FERRITIN 17 17  TIBC 369 340  IRON 17* 10*  RETICCTPCT QUESTIONABLE RESULTS, RECOMMEND RECOLLECT TO VERIFY 1.0   Sepsis Labs: Recent Labs  Lab 10/18/19 0540 10/18/19 0739  PROCALCITON QUESTIONABLE RESULTS, RECOMMEND RECOLLECT TO VERIFY 2.13  LATICACIDVEN 1.1 0.9    Recent Results (from the past 240 hour(s))  SARS Coronavirus 2 by RT PCR (hospital order, performed in [place] hospital lab) Nasopharyngeal Nasopharyngeal Swab     Status: Abnormal   Collection Time: 10/17/19  5:26 PM   Specimen: Nasopharyngeal Swab  Result Value Ref Range Status   SARS Coronavirus 2 POSITIVE (A) NEGATIVE Final    Comment: RESULT CALLED TO, READ BACK BY AND VERIFIED WITH: I.HODGES AT 1950 ON 10/17/19  BY N.THOMPSON (NOTE) SARS-CoV-2 target nucleic acids are DETECTED  SARS-CoV-2 RNA is generally detectable in upper respiratory specimens  during the acute phase of infection.  Positive results are indicative  of the presence of the identified virus, but do not rule out bacterial infection or co-infection with other pathogens not detected by the test.  Clinical correlation with patient history and  other diagnostic information is necessary to determine patient infection status.  The expected result is negative.  Fact Sheet for Patients:   StrictlyIdeas.no   Fact Sheet for Healthcare Providers:   BankingDealers.co.za    This test is not yet approved or cleared by the [place] FDA and  has been authorized for detection and/or diagnosis of SARS-CoV-2 by FDA under an Emergency Use Authorization (EUA).  This EUA will remain in effect (meaning t his test can be used) for the duration of  the COVID-19 declaration under Section 564(b)(1) of the Act, 21 U.S.C. section 360-bbb-3(b)(1), unless the authorization is terminated or revoked sooner.  Performed at [facility], [place] [address], [place], [place] [address]          Radiology Studies: DG Chest 2 View  Result Date: 10/17/2019 CLINICAL DATA:  Epigastric pain EXAM: CHEST - 2 VIEW COMPARISON:  None. FINDINGS: Frontal and lateral views of the chest demonstrate an unremarkable cardiac silhouette. Multifocal bibasilar ground-glass airspace disease could reflect atypical infection such as COVID-19. No effusion or pneumothorax. Surgical clips are seen at the gastroesophageal junction. Lucency beneath the left hemidiaphragm, with gas fluid level noted on lateral view, could reflect a distended stomach. Please correlate for any peritoneal signs that would suggest free gas. If further evaluation is desired, decubitus views of the abdomen or CT of the abdomen could be considered.  IMPRESSION: 1. Bibasilar ground-glass airspace disease, which could reflect multifocal pneumonia versus edema. Pattern is consistent with COVID-19. 2. Nonspecific lucency left upper quadrant, which may reflect a distended gastric fundus. If there is concern for free intraperitoneal gas, decubitus radiographs or CT could be considered. Critical Value/emergent results were called by telephone at the time of interpretation on 10/17/2019 at 6:36 pm to provider Dr. Tomi Bamberger, who verbally acknowledged these results. Electronically Signed   By: Randa Ngo M.D.   On: 10/17/2019 18:36   DG Abd 1 View  Result Date: 10/18/2019 CLINICAL DATA:  Reported viscus perforation EXAM: ABDOMEN - 1 VIEW COMPARISON:  October 17, 2019 CT abdomen and pelvis FINDINGS: Postoperative changes noted at multiple sites in the abdomen and pelvis. Pneumoperitoneum less well seen on supine examination compared to CT. No appreciable bowel dilatation or air-fluid levels. No bone lesions. 1.7 x 1.3 cm calcification in the left upper abdomen may represent a peripheral arterial aneurysm, unchanged from 1 day prior CT examination. IMPRESSION: Pneumoperitoneum is better seen on recent CT. No bowel obstruction evident. Extensive postoperative change at multiple sites noted. Electronically Signed   By: Lowella Grip III M.D.   On: 10/18/2019 11:37   CT ABDOMEN PELVIS W CONTRAST  Result Date: 10/17/2019 CLINICAL DATA:  Abdominal pain and diarrhea.  Generalized weakness. EXAM: CT ABDOMEN AND PELVIS WITH CONTRAST TECHNIQUE: Multidetector CT imaging of the abdomen and pelvis was performed using the standard protocol following bolus administration of intravenous contrast. CONTRAST:  12mL OMNIPAQUE IOHEXOL 350 MG/ML SOLN COMPARISON:  Chest radiograph earlier this day. FINDINGS: Lower chest: Peripheral ground-glass and consolidative opacities in both lower lobes. Minimal pleural thickening without effusion. Hepatobiliary: No focal liver abnormality is  seen. No gallstones, gallbladder wall thickening, or biliary dilatation. Air tracking about the left lobe of the liver appears to be related to free intra-abdominal air rather than biliary or portal venous gas. Pancreas: No ductal dilatation or inflammation. Spleen: Normal in size without focal abnormality. Adrenals/Urinary Tract: Normal adrenal glands. No hydronephrosis or perinephric edema. Homogeneous renal enhancement with symmetric excretion on delayed phase imaging. Urinary bladder is physiologically distended without wall thickening. Stomach/Bowel: Multiple foci of free air scattered throughout the upper abdomen as well as lower anterior mesentery and lower anterior abdominal cavity. Site of perforation is not definitively delineated. Gastric bypass anatomy. Bowel evaluation is limited in the absence of enteric contrast and paucity of intra-abdominal fat. The excluded gastric remnant is minimally fluid-filled but nondilated. There is minimal wall thickening of the proximal Roux limb. The jejunal anastomosis in this grossly normal. There is no small bowel obstruction or inflammation. Small bowel loop extends into an umbilical hernia but no evidence of bowel wall thickening or proximal dilatation. Pelvic bowel loops are fluid-filled but nondilated. Colon is difficult to define. There is liquid stool in the proximal colon. Normal appendix tentatively visualized. There is transverse colonic tortuosity. The majority of the colon is completely decompressed. Vascular/Lymphatic: Normal caliber abdominal aorta. Patent portal vein. No evidence of portal venous or mesenteric gas. No gross abdominopelvic adenopathy. Reproductive: Retroverted uterus with probable posterior fundal fibroid. No evidence of adnexal mass. Other: Multiple foci of free air throughout the abdomen. Inferior seen throughout the mesentery, anteriorly as well as tracking into the upper abdomen. Small amount of free fluid in the pelvis. There may be  an organized collection posterior to the uterus measuring 4.9 cm, series 2, image 63. Musculoskeletal: Degenerative disc disease at L5-S1. There are no acute or suspicious osseous abnormalities. IMPRESSION: 1. Multiple foci of free air throughout the abdomen consistent with perforated viscus. Site of perforation is not definitively delineated, however there is minimal wall thickening of the proximal Roux limb. In the setting of gastric bypass, perforated marginal ulcer is considered. There is no other site of bowel inflammation. 2. Small amount of free fluid in the pelvis. There may be an organized collection posterior to the uterus measuring 4.9 cm. 3. Peripheral ground-glass and consolidative opacities in both lower lobes, consistent with COVID pneumonia. Critical Value/emergent results were called by telephone at the time of interpretation on 10/17/2019 at 11:26 pm to Dr Dorie Rank , who  verbally acknowledged these results. Electronically Signed   By: Keith Rake M.D.   On: 10/17/2019 23:28        Scheduled Meds: . sodium chloride   Intravenous Once   Continuous Infusions: . sodium chloride 125 mL/hr at 10/18/19 0602  . pantoprozole (PROTONIX) infusion 8 mg/hr (10/18/19 1342)  . piperacillin-tazobactam (ZOSYN)  IV 3.375 g (10/18/19 0603)  . [START ON 10/19/2019] remdesivir 100 mg in NS 100 mL       LOS: 0 days    Time spent: 35 minutes    Barb Merino, MD [facility] Pager [phone]

## 2019-10-18 NOTE — Progress Notes (Signed)
Assessment & Plan: HD#2 - perforated viscus, Covid pneumonia  NPO, IV hydration  IV Zosyn  IV PPI infusion  Patient seen and examined in ER.  Discussed with Dr. Rosendo Gros.  Probable marginal ulcer with perforation due to patient non-compliance with PPI's.  Likely recent GI bleeding from ulceration, probable perforation.  Due to time course, may have sealed perforation spontaneously.  Will monitor closely with the medical service although patient may yet require operative intervention for repair of perforation.  Discussed with patient at the bedside who understands and agrees with plans.        Kendra Gemma, MD       [facility]       Office: [phone]   Chief Complaint: Chest pain, abdominal pain  Subjective: Patient in ER, on stretcher, relatively comfortable, complains of left sided chest pain.  Objective: Vital signs in last 24 hours: Temp:  [98.5 F (36.9 C)-100.6 F (38.1 C)] 98.5 F (36.9 C) (09/08 1218) Pulse Rate:  [61-91] 73 (09/08 1218) Resp:  [16-30] 20 (09/08 1218) BP: (103-132)/(61-79) 111/75 (09/08 1218) SpO2:  [92 %-100 %] 97 % (09/08 1218)    Intake/Output from previous day: 09/07 0701 - 09/08 0700 In: 290 [IV Piggyback:290] Out: -  Intake/Output this shift: Total I/O In: 315 [Blood:315] Out: -   Physical Exam: HEENT - sclerae clear, mucous membranes moist Neck - soft Chest - coarse bilaterally Cor - RRR Abdomen - soft, mild diffuse tenderness, minimal distension; well-healed laparoscopic incisions Ext - no edema, non-tender Neuro - alert & oriented, no focal deficits  Lab Results:  Recent Labs    10/18/19 0540 10/18/19 0739  WBC QUESTIONABLE RESULTS, RECOMMEND RECOLLECT TO VERIFY 13.6*  HGB QUESTIONABLE RESULTS, RECOMMEND RECOLLECT TO VERIFY 6.2*  HCT QUESTIONABLE RESULTS, RECOMMEND RECOLLECT TO VERIFY 21.8*  PLT QUESTIONABLE RESULTS, RECOMMEND RECOLLECT TO VERIFY 721*   BMET Recent Labs    10/17/19 1723  NA  136  K 4.4  CL 99  CO2 18*  GLUCOSE 118*  BUN 16  CREATININE 0.69  CALCIUM 9.1   PT/INR Recent Labs    10/18/19 0040  LABPROT 16.5*  INR 1.4*   Comprehensive Metabolic Panel:    Component Value Date/Time   NA 136 10/17/2019 1723   K 4.4 10/17/2019 1723   CL 99 10/17/2019 1723   CO2 18 (L) 10/17/2019 1723   BUN 16 10/17/2019 1723   CREATININE 0.69 10/17/2019 1723   GLUCOSE 118 (H) 10/17/2019 1723   CALCIUM 9.1 10/17/2019 1723   AST 28 10/17/2019 1723   ALT 10 10/17/2019 1723   ALKPHOS 37 (L) 10/17/2019 1723   BILITOT 0.7 10/17/2019 1723   PROT 9.2 (H) 10/17/2019 1723   ALBUMIN 3.9 10/17/2019 1723    Studies/Results: DG Chest 2 View  Result Date: 10/17/2019 CLINICAL DATA:  Epigastric pain EXAM: CHEST - 2 VIEW COMPARISON:  None. FINDINGS: Frontal and lateral views of the chest demonstrate an unremarkable cardiac silhouette. Multifocal bibasilar ground-glass airspace disease could reflect atypical infection such as COVID-19. No effusion or pneumothorax. Surgical clips are seen at the gastroesophageal junction. Lucency beneath the left hemidiaphragm, with gas fluid level noted on lateral view, could reflect a distended stomach. Please correlate for any peritoneal signs that would suggest free gas. If further evaluation is desired, decubitus views of the abdomen or CT of the abdomen could be considered. IMPRESSION: 1. Bibasilar ground-glass airspace disease, which could reflect multifocal pneumonia versus edema. Pattern is consistent with COVID-19. 2.  Nonspecific lucency left upper quadrant, which may reflect a distended gastric fundus. If there is concern for free intraperitoneal gas, decubitus radiographs or CT could be considered. Critical Value/emergent results were called by telephone at the time of interpretation on 10/17/2019 at 6:36 pm to provider Dr. Tomi Bamberger, who verbally acknowledged these results. Electronically Signed   By: Randa Ngo M.D.   On: 10/17/2019 18:36   DG Abd  1 View  Result Date: 10/18/2019 CLINICAL DATA:  Reported viscus perforation EXAM: ABDOMEN - 1 VIEW COMPARISON:  October 17, 2019 CT abdomen and pelvis FINDINGS: Postoperative changes noted at multiple sites in the abdomen and pelvis. Pneumoperitoneum less well seen on supine examination compared to CT. No appreciable bowel dilatation or air-fluid levels. No bone lesions. 1.7 x 1.3 cm calcification in the left upper abdomen may represent a peripheral arterial aneurysm, unchanged from 1 day prior CT examination. IMPRESSION: Pneumoperitoneum is better seen on recent CT. No bowel obstruction evident. Extensive postoperative change at multiple sites noted. Electronically Signed   By: Lowella Grip III M.D.   On: 10/18/2019 11:37   CT ABDOMEN PELVIS W CONTRAST  Result Date: 10/17/2019 CLINICAL DATA:  Abdominal pain and diarrhea.  Generalized weakness. EXAM: CT ABDOMEN AND PELVIS WITH CONTRAST TECHNIQUE: Multidetector CT imaging of the abdomen and pelvis was performed using the standard protocol following bolus administration of intravenous contrast. CONTRAST:  155mL OMNIPAQUE IOHEXOL 350 MG/ML SOLN COMPARISON:  Chest radiograph earlier this day. FINDINGS: Lower chest: Peripheral ground-glass and consolidative opacities in both lower lobes. Minimal pleural thickening without effusion. Hepatobiliary: No focal liver abnormality is seen. No gallstones, gallbladder wall thickening, or biliary dilatation. Air tracking about the left lobe of the liver appears to be related to free intra-abdominal air rather than biliary or portal venous gas. Pancreas: No ductal dilatation or inflammation. Spleen: Normal in size without focal abnormality. Adrenals/Urinary Tract: Normal adrenal glands. No hydronephrosis or perinephric edema. Homogeneous renal enhancement with symmetric excretion on delayed phase imaging. Urinary bladder is physiologically distended without wall thickening. Stomach/Bowel: Multiple foci of free air  scattered throughout the upper abdomen as well as lower anterior mesentery and lower anterior abdominal cavity. Site of perforation is not definitively delineated. Gastric bypass anatomy. Bowel evaluation is limited in the absence of enteric contrast and paucity of intra-abdominal fat. The excluded gastric remnant is minimally fluid-filled but nondilated. There is minimal wall thickening of the proximal Roux limb. The jejunal anastomosis in this grossly normal. There is no small bowel obstruction or inflammation. Small bowel loop extends into an umbilical hernia but no evidence of bowel wall thickening or proximal dilatation. Pelvic bowel loops are fluid-filled but nondilated. Colon is difficult to define. There is liquid stool in the proximal colon. Normal appendix tentatively visualized. There is transverse colonic tortuosity. The majority of the colon is completely decompressed. Vascular/Lymphatic: Normal caliber abdominal aorta. Patent portal vein. No evidence of portal venous or mesenteric gas. No gross abdominopelvic adenopathy. Reproductive: Retroverted uterus with probable posterior fundal fibroid. No evidence of adnexal mass. Other: Multiple foci of free air throughout the abdomen. Inferior seen throughout the mesentery, anteriorly as well as tracking into the upper abdomen. Small amount of free fluid in the pelvis. There may be an organized collection posterior to the uterus measuring 4.9 cm, series 2, image 63. Musculoskeletal: Degenerative disc disease at L5-S1. There are no acute or suspicious osseous abnormalities. IMPRESSION: 1. Multiple foci of free air throughout the abdomen consistent with perforated viscus. Site of perforation is not definitively  delineated, however there is minimal wall thickening of the proximal Roux limb. In the setting of gastric bypass, perforated marginal ulcer is considered. There is no other site of bowel inflammation. 2. Small amount of free fluid in the pelvis. There may  be an organized collection posterior to the uterus measuring 4.9 cm. 3. Peripheral ground-glass and consolidative opacities in both lower lobes, consistent with COVID pneumonia. Critical Value/emergent results were called by telephone at the time of interpretation on 10/17/2019 at 11:26 pm to Dr Dorie Rank , who verbally acknowledged these results. Electronically Signed   By: Keith Rake M.D.   On: 10/17/2019 23:28      Kendra [name] 10/18/2019  Patient ID: Othelia Pulling, female   DOB: 04-Feb-1976, 44 y.o.   MRN: [id number]

## 2019-10-18 NOTE — ED Notes (Signed)
Date and time results received: 10/18/19 0612 (use smartphrase ".now" to insert current time)  Test: Hgb Critical Value: 6.5  Name of Provider Notified: Marlowe Sax, MD  Orders Received? Or Actions Taken?: Orders Received - See Orders for details

## 2019-10-18 NOTE — H&P (Signed)
History and Physical    Casaundra Takacs UVO:[id number] DOB: 04-28-1975 DOA: 10/17/2019  PCP: System, Pcp Not In Patient coming from: Home  Chief Complaint: Abdominal pain, Covid-like symptoms  HPI: Kendra [name] is a 44 y.o. female with a past medical history of gastric bypass surgery presenting to the ED for evaluation of abdominal pain and Covid-like symptoms.  Patient states for the past 2 weeks she is having generalized weakness, body aches, cough, and shortness of breath.  She has not been vaccinated against Covid.  Yesterday she went to use the toilet and her stool was all black in color.  Soon after having the bowel movement she started having epigastric abdominal pain and chest pain.  States she had gastric bypass surgery done in 2009 and is supposed to take an acid suppressing medication but does not take it.  ED Course: Febrile with temperature 100.6 F.  Not tachycardic or hypotensive.  Not hypoxic.  Dark stool noted on rectal exam and FOBT positive.  WBC 12.2, hemoglobin 8.2, hematocrit 25.6, platelet 949k.  Sodium 136, potassium 4.4, chloride 99, bicarb 18, BUN 16, creatinine 0.6, glucose 118, anion gap 19.  Lipase and LFTs normal.  Peripheral blood smear pending.  SARS-CoV-2 PCR test positive.  Beta hCG negative.  High-sensitivity troponin pending.  INR 1.4.  UA not suggestive of infection.  Chest x-ray showing bibasilar groundglass airspace disease concerning for COVID-19 viral multifocal pneumonia.  CT abdomen pelvis showing findings concerning for perforated viscus.  The site of perforation is not definitely delineated, in the setting of gastric bypass surgery, perforated marginal ulcer is considered. Small amount of free air in the pelvis.  There may be an organized collection posterior to the uterus measuring 4.9 cm.  Also showing peripheral groundglass and consolidative opacities in both lower lobes consistent with Covid pneumonia.  Patient was given Tylenol, fentanyl, morphine,  Zofran, IV Protonix, Zosyn, and a 500 cc fluid bolus.  Patient was seen by Dr. Rosendo Gros from general surgery who recommended continuing antibiotics, serial abdominal exams, and continuing PPI drip.  If the patient's pain continues, she will likely require exploratory laparotomy.  Keep n.p.o.  Review of Systems:  All systems reviewed and apart from history of presenting illness, are negative.  History reviewed. No pertinent past medical history.  Past Surgical History:  Procedure Laterality Date  . GASTRIC BYPASS       reports that she has never smoked. She has never used smokeless tobacco. She reports previous alcohol use. She reports current drug use. Drug: Marijuana.  No Known Allergies  History reviewed. No pertinent family history.  Prior to Admission medications   Not on File    Physical Exam: Vitals:   10/18/19 0330 10/18/19 0400 10/18/19 0430 10/18/19 0500  BP: 112/71 113/73 103/72 119/78  Pulse: 66 73 67 76  Resp: (!) 27 19 (!) 28 17  Temp:      TempSrc:      SpO2: 96% 96% 95% 97%    Physical Exam Constitutional:      General: She is not in acute distress. HENT:     Head: Normocephalic and atraumatic.  Eyes:     Extraocular Movements: Extraocular movements intact.     Conjunctiva/sclera: Conjunctivae normal.  Cardiovascular:     Rate and Rhythm: Normal rate and regular rhythm.     Pulses: Normal pulses.  Pulmonary:     Effort: Pulmonary effort is normal.     Breath sounds: No wheezing.     Comments: Satting in  the upper 90s on room air Abdominal:     General: Bowel sounds are normal. There is no distension.     Palpations: Abdomen is soft.     Tenderness: There is abdominal tenderness. There is no guarding or rebound.     Comments: Epigastrium tender to palpation  Musculoskeletal:        General: No swelling or tenderness.     Cervical back: Normal range of motion and neck supple.  Skin:    General: Skin is warm and dry.  Neurological:     Mental  Status: She is alert and oriented to person, place, and time.     Labs on Admission: I have personally reviewed following labs and imaging studies  CBC: Recent Labs  Lab 10/17/19 1723  WBC 12.2*  HGB 8.2*  HCT 25.6*  MCV 75.5*  PLT 245*   Basic Metabolic Panel: Recent Labs  Lab 10/17/19 1723  NA 136  K 4.4  CL 99  CO2 18*  GLUCOSE 118*  BUN 16  CREATININE 0.69  CALCIUM 9.1   GFR: CrCl cannot be calculated (Unknown ideal weight.). Liver Function Tests: Recent Labs  Lab 10/17/19 1723  AST 28  ALT 10  ALKPHOS 37*  BILITOT 0.7  PROT 9.2*  ALBUMIN 3.9   Recent Labs  Lab 10/17/19 1723  LIPASE 20   No results for input(s): AMMONIA in the last 168 hours. Coagulation Profile: Recent Labs  Lab 10/18/19 0040  INR 1.4*   Cardiac Enzymes: No results for input(s): CKTOTAL, CKMB, CKMBINDEX, TROPONINI in the last 168 hours. BNP (last 3 results) No results for input(s): PROBNP in the last 8760 hours. HbA1C: No results for input(s): HGBA1C in the last 72 hours. CBG: No results for input(s): GLUCAP in the last 168 hours. Lipid Profile: No results for input(s): CHOL, HDL, LDLCALC, TRIG, CHOLHDL, LDLDIRECT in the last 72 hours. Thyroid Function Tests: No results for input(s): TSH, T4TOTAL, FREET4, T3FREE, THYROIDAB in the last 72 hours. Anemia Panel: No results for input(s): VITAMINB12, FOLATE, FERRITIN, TIBC, IRON, RETICCTPCT in the last 72 hours. Urine analysis:    Component Value Date/Time   COLORURINE YELLOW 10/18/2019 0100   APPEARANCEUR CLEAR 10/18/2019 0100   LABSPEC >1.046 (H) 10/18/2019 0100   PHURINE 6.0 10/18/2019 0100   GLUCOSEU NEGATIVE 10/18/2019 0100   HGBUR NEGATIVE 10/18/2019 0100   BILIRUBINUR NEGATIVE 10/18/2019 0100   KETONESUR 80 (A) 10/18/2019 0100   PROTEINUR NEGATIVE 10/18/2019 0100   NITRITE NEGATIVE 10/18/2019 0100   LEUKOCYTESUR NEGATIVE 10/18/2019 0100    Radiological Exams on Admission: DG Chest 2 View  Result Date:  10/17/2019 CLINICAL DATA:  Epigastric pain EXAM: CHEST - 2 VIEW COMPARISON:  None. FINDINGS: Frontal and lateral views of the chest demonstrate an unremarkable cardiac silhouette. Multifocal bibasilar ground-glass airspace disease could reflect atypical infection such as COVID-19. No effusion or pneumothorax. Surgical clips are seen at the gastroesophageal junction. Lucency beneath the left hemidiaphragm, with gas fluid level noted on lateral view, could reflect a distended stomach. Please correlate for any peritoneal signs that would suggest free gas. If further evaluation is desired, decubitus views of the abdomen or CT of the abdomen could be considered. IMPRESSION: 1. Bibasilar ground-glass airspace disease, which could reflect multifocal pneumonia versus edema. Pattern is consistent with COVID-19. 2. Nonspecific lucency left upper quadrant, which may reflect a distended gastric fundus. If there is concern for free intraperitoneal gas, decubitus radiographs or CT could be considered. Critical Value/emergent results were called by telephone  at the time of interpretation on 10/17/2019 at 6:36 pm to provider Dr. Tomi Bamberger, who verbally acknowledged these results. Electronically Signed   By: Randa Ngo M.D.   On: 10/17/2019 18:36   CT ABDOMEN PELVIS W CONTRAST  Result Date: 10/17/2019 CLINICAL DATA:  Abdominal pain and diarrhea.  Generalized weakness. EXAM: CT ABDOMEN AND PELVIS WITH CONTRAST TECHNIQUE: Multidetector CT imaging of the abdomen and pelvis was performed using the standard protocol following bolus administration of intravenous contrast. CONTRAST:  160mL OMNIPAQUE IOHEXOL 350 MG/ML SOLN COMPARISON:  Chest radiograph earlier this day. FINDINGS: Lower chest: Peripheral ground-glass and consolidative opacities in both lower lobes. Minimal pleural thickening without effusion. Hepatobiliary: No focal liver abnormality is seen. No gallstones, gallbladder wall thickening, or biliary dilatation. Air tracking  about the left lobe of the liver appears to be related to free intra-abdominal air rather than biliary or portal venous gas. Pancreas: No ductal dilatation or inflammation. Spleen: Normal in size without focal abnormality. Adrenals/Urinary Tract: Normal adrenal glands. No hydronephrosis or perinephric edema. Homogeneous renal enhancement with symmetric excretion on delayed phase imaging. Urinary bladder is physiologically distended without wall thickening. Stomach/Bowel: Multiple foci of free air scattered throughout the upper abdomen as well as lower anterior mesentery and lower anterior abdominal cavity. Site of perforation is not definitively delineated. Gastric bypass anatomy. Bowel evaluation is limited in the absence of enteric contrast and paucity of intra-abdominal fat. The excluded gastric remnant is minimally fluid-filled but nondilated. There is minimal wall thickening of the proximal Roux limb. The jejunal anastomosis in this grossly normal. There is no small bowel obstruction or inflammation. Small bowel loop extends into an umbilical hernia but no evidence of bowel wall thickening or proximal dilatation. Pelvic bowel loops are fluid-filled but nondilated. Colon is difficult to define. There is liquid stool in the proximal colon. Normal appendix tentatively visualized. There is transverse colonic tortuosity. The majority of the colon is completely decompressed. Vascular/Lymphatic: Normal caliber abdominal aorta. Patent portal vein. No evidence of portal venous or mesenteric gas. No gross abdominopelvic adenopathy. Reproductive: Retroverted uterus with probable posterior fundal fibroid. No evidence of adnexal mass. Other: Multiple foci of free air throughout the abdomen. Inferior seen throughout the mesentery, anteriorly as well as tracking into the upper abdomen. Small amount of free fluid in the pelvis. There may be an organized collection posterior to the uterus measuring 4.9 cm, series 2, image 63.  Musculoskeletal: Degenerative disc disease at L5-S1. There are no acute or suspicious osseous abnormalities. IMPRESSION: 1. Multiple foci of free air throughout the abdomen consistent with perforated viscus. Site of perforation is not definitively delineated, however there is minimal wall thickening of the proximal Roux limb. In the setting of gastric bypass, perforated marginal ulcer is considered. There is no other site of bowel inflammation. 2. Small amount of free fluid in the pelvis. There may be an organized collection posterior to the uterus measuring 4.9 cm. 3. Peripheral ground-glass and consolidative opacities in both lower lobes, consistent with COVID pneumonia. Critical Value/emergent results were called by telephone at the time of interpretation on 10/17/2019 at 11:26 pm to Dr Dorie Rank , who verbally acknowledged these results. Electronically Signed   By: Keith Rake M.D.   On: 10/17/2019 23:28    EKG: Independently reviewed.  Sinus rhythm, borderline T wave abnormalities.  No prior tracing for comparison.  Assessment/Plan Principal Problem:   Perforated viscus Active Problems:   History of gastric bypass   SIRS (systemic inflammatory response syndrome) (HCC)  Pneumonia due to COVID-19 virus   Anemia   Perforated viscus/acute upper GI bleed in the setting of history of gastric bypass SIRS Dark stool noted on rectal exam and FOBT positive. CT abdomen pelvis showing findings concerning for perforated viscus.  The site of perforation is not definitely delineated, in the setting of gastric bypass surgery, perforated marginal ulcer is considered.  Febrile and labs showing mild leukocytosis.  Not tachycardic or hypotensive. -General surgery consulted and recommendations are to keep n.p.o., continue antibiotics, serial abdominal exams, and continuing PPI drip.  If the patient's pain continues, she will likely require exploratory laparotomy. -Check lactic acid level, order blood cultures,  continue to monitor WBC count -Morphine as needed for pain, Tylenol as needed for fevers, antiemetic as needed -IV fluid hydration  COVID-19 viral multifocal pneumonia: Also likely contributing to SIRS.  Patient is currently not hypoxic.  SARS-CoV-2 PCR test positive.  Imaging showing bibasilar opacities concerning for Covid pnuemonia.  -Remdesivir -Will hold off starting steroid at this time given perforated viscus -Tylenol as needed -Bronchodilator as needed -Check inflammatory markers including ferritin, fibrinogen, D-dimer, CRP, LDH -Check procalcitonin level -Daily CBC with differential, CMP, CRP, D-dimer, LDH -Airborne and contact precautions -Incentive spirometry, flutter valve -Encourage prone positioning -Continuous pulse ox -Supplemental oxygen as needed to keep oxygen saturation above 90% -Blood culture x2 ordered  Symptomatic acute blood loss anemia: Suspect due to upper GI bleed.  Hemoglobin 8.2, no prior labs for comparison. -Type and screen, monitor H&H, transfuse if hemoglobin less than 7.  Check anemia panel.  Thrombocytosis: Platelet count 949k, no prior labs for comparison. -Peripheral blood smear pending, check iron studies, CRP, repeat CBC in a.m.  High anion gap metabolic acidosis: Bicarb 18, anion gap 19. -IV fluid hydration, check lactic acid level  Chest pain: EKG with borderline T wave abnormalities.  Patient is endorsing chest pain. -Cardiac monitoring, check high-sensitivity troponin level  Abnormal CT finding: CT showing an organized collection posterior to the uterus measuring 4.9 cm. ?Fibroid. -Will need nonurgent pelvic ultrasound for further evaluation when more stable.  DVT prophylaxis: SCDs Code Status: Full code Family Communication: No family available at this time. Disposition Plan: Status is: Inpatient  Remains inpatient appropriate because:IV treatments appropriate due to intensity of illness or inability to take PO and Inpatient level of  care appropriate due to severity of illness   Dispo: The patient is from: Home              Anticipated d/c is to: Home              Anticipated d/c date is: > 3 days              Patient currently is not medically stable to d/c.  The medical decision making on this patient was of high complexity and the patient is at high risk for clinical deterioration, therefore this is a level 3 visit.  Shela Leff MD [facility]  If 7PM-7AM, please contact night-coverage [email or web address]  10/18/2019, 5:04 AM

## 2019-10-18 NOTE — Progress Notes (Addendum)
Examined patient again this afternoon at 1600.  Cc - abdominal pain is the same as earlier in the day - no better no worse. When pain meds wear off she reports the same, generalized pain with radiation to her shoulder. States pain is worse with deep breathing. Her 2nd unit pRBC's was just hung by RN.  Vitals:   10/18/19 1449 10/18/19 1558  BP: 116/79 116/79  Pulse: 75 73  Resp: 16 16  Temp: 99 F (37.2 C) 99 F (37.2 C)  SpO2: 97%    Exam: Gen- alert, NAD, non-toxic, NAD CV: RRR Pulm: CTAB Abd: flat, soft, mild epigastric tenderness without guarding, no peritonitis  Ext: symmetrical. MAE spontaneously    I do not think this patient warrants emergent surgery this time but should be closely monitored. Continue NPO/bowel rest, IVF, IV abx, serial abdominal exams. We will re-evaluate in the morning.    Obie Dredge, PA-C General & Trauma Surgery

## 2019-10-19 LAB — BPAM RBC
Blood Product Expiration Date: 202110012359
Blood Product Expiration Date: 202110072359
ISSUE DATE / TIME: 202109081151
ISSUE DATE / TIME: 202109081544
Unit Type and Rh: 7300
Unit Type and Rh: 7300

## 2019-10-19 LAB — COMPREHENSIVE METABOLIC PANEL
ALT: 9 U/L (ref 0–44)
AST: 18 U/L (ref 15–41)
Albumin: 2.8 g/dL — ABNORMAL LOW (ref 3.5–5.0)
Alkaline Phosphatase: 40 U/L (ref 38–126)
Anion gap: 11 (ref 5–15)
BUN: 12 mg/dL (ref 6–20)
CO2: 22 mmol/L (ref 22–32)
Calcium: 8.4 mg/dL — ABNORMAL LOW (ref 8.9–10.3)
Chloride: 107 mmol/L (ref 98–111)
Creatinine, Ser: 0.59 mg/dL (ref 0.44–1.00)
GFR calc Af Amer: >60 mL/min (ref >60)
GFR calc non Af Amer: >60 mL/min (ref >60)
Glucose, Bld: 104 mg/dL — ABNORMAL HIGH (ref 70–99)
Potassium: 3.9 mmol/L (ref 3.5–5.1)
Sodium: 140 mmol/L (ref 135–145)
Total Bilirubin: 1 mg/dL (ref 0.3–1.2)
Total Protein: 7.6 g/dL (ref 6.5–8.1)

## 2019-10-19 LAB — CBC WITH DIFFERENTIAL/PLATELET
Abs Immature Granulocytes: 0.14 10*3/uL — ABNORMAL HIGH (ref 0.00–0.07)
Basophils Absolute: 0 10*3/uL (ref 0.0–0.1)
Basophils Relative: 0 %
Eosinophils Absolute: 0 10*3/uL (ref 0.0–0.5)
Eosinophils Relative: 0 %
HCT: 31.2 % — ABNORMAL LOW (ref 36.0–46.0)
Hemoglobin: 9.4 g/dL — ABNORMAL LOW (ref 12.0–15.0)
Immature Granulocytes: 1 %
Lymphocytes Relative: 7 %
Lymphs Abs: 1.1 10*3/uL (ref 0.7–4.0)
MCH: 22.4 pg — ABNORMAL LOW (ref 26.0–34.0)
MCHC: 30.1 g/dL (ref 30.0–36.0)
MCV: 74.5 fL — ABNORMAL LOW (ref 80.0–100.0)
Monocytes Absolute: 0.3 10*3/uL (ref 0.1–1.0)
Monocytes Relative: 2 %
Neutro Abs: 15 10*3/uL — ABNORMAL HIGH (ref 1.7–7.7)
Neutrophils Relative %: 90 %
Platelets: 602 10*3/uL — ABNORMAL HIGH (ref 150–400)
RBC: 4.19 MIL/uL (ref 3.87–5.11)
RDW: 30 % — ABNORMAL HIGH (ref 11.5–15.5)
WBC: 16.5 10*3/uL — ABNORMAL HIGH (ref 4.0–10.5)
nRBC: 0.1 % (ref 0.0–0.2)

## 2019-10-19 LAB — TYPE AND SCREEN
ABO/RH(D): B POS
Antibody Screen: NEGATIVE
Unit division: 0
Unit division: 0

## 2019-10-19 LAB — D-DIMER, QUANTITATIVE: D-Dimer, Quant: >20 ug/mL-FEU — ABNORMAL HIGH (ref 0.00–0.50)

## 2019-10-19 LAB — C-REACTIVE PROTEIN: CRP: 32.9 mg/dL — ABNORMAL HIGH (ref <1.0)

## 2019-10-19 MED ORDER — PROMETHAZINE HCL 25 MG/ML IJ SOLN
12.5000 mg | Freq: Four times a day (QID) | INTRAMUSCULAR | Status: DC | PRN
  Administered 2019-10-19: 12.5 mg via INTRAVENOUS
  Filled 2019-10-19 (×2): qty 1

## 2019-10-19 NOTE — Progress Notes (Signed)
CC: abdominal pain  Subjective: Patient complains of ongoing abdominal pain.  She says this is about the same as yesterday.  She is tender over her entire abdominal wall.  She also complains of shortness of breath, although she does not appear dyspneic currently.  Objective: Vital signs in last 24 hours: Temp:  [98.2 F (36.8 C)-99.4 F (37.4 C)] 99.2 F (37.3 C) (09/09 0353) Pulse Rate:  [61-91] 91 (09/09 0353) Resp:  [15-24] 15 (09/09 0353) BP: (107-129)/(63-82) 129/77 (09/09 0353) SpO2:  [92 %-100 %] 100 % (09/09 0353) Last BM Date: 10/18/19 2117 IV No output recorded T-max 99.4 other vital signs are stable. CMP is normal except for glucose of 104, calcium 8.4,. CRP 32.9 WBC 16.5 H/H 9.4/31.2 Platelets 602,000 D-dimer >20 CT scan 10/17/2019: Multi focal free air throughout the abdomen consistent with perforated viscus.  Site of perforation is not definitely delineated however there is minimal wall thickening of the proximal Roux limb.  In the setting gastric bypass perforated marginal ulcer is considered another site of inflammation.  Small amount of free fluid in the pelvis.  There may be an organized collection posterior uterus measuring 4.9 cm.  Peripheral groundglass opacities and consolidative opacities in both lower lobes consistent with Covid pneumonia  Intake/Output from previous day: 09/08 0701 - 09/09 0700 In: 2117.1 [I.V.:1282.1; Blood:630; IV Piggyback:205] Out: -  Intake/Output this shift: No intake/output data recorded.  General appearance: alert, cooperative, mild distress and ongoing abdominal pain Resp: clear best I could tell with house sethoscope GI: she is tender all over her abdomen, I can't tell if it's worse than yesterday, she says it's about the same.  BS hypoactive, no BM  Lab Results:  Recent Labs    10/18/19 0739 10/18/19 0739 10/18/19 2051 10/19/19 0404  WBC 13.6*  --   --  16.5*  HGB 6.2*   < > 9.2* 9.4*  HCT 21.8*   < > 31.3*  31.2*  PLT 721*  --   --  602*   < > = values in this interval not displayed.    BMET Recent Labs    10/17/19 1723 10/19/19 0404  NA 136 140  K 4.4 3.9  CL 99 107  CO2 18* 22  GLUCOSE 118* 104*  BUN 16 12  CREATININE 0.69 0.59  CALCIUM 9.1 8.4*   PT/INR Recent Labs    10/18/19 0040  LABPROT 16.5*  INR 1.4*    Recent Labs  Lab 10/17/19 1723 10/19/19 0404  AST 28 18  ALT 10 9  ALKPHOS 37* 40  BILITOT 0.7 1.0  PROT 9.2* 7.6  ALBUMIN 3.9 2.8*     Lipase     Component Value Date/Time   LIPASE 20 10/17/2019 1723     Medications: . sodium chloride   Intravenous Once   . pantoprozole (PROTONIX) infusion 8 mg/hr (10/19/19 4235)  . piperacillin-tazobactam (ZOSYN)  IV 3.375 g (10/19/19 0535)  . remdesivir 100 mg in NS 100 mL     Assessment/Plan Symptomatic anemia Thrombocytosis Chest pain  SIRS  - WBC 12.2>>13.6>>16.5  - CRP 20.6>>21.5>>32.9 Covid positive - Viral multifocal pneumonia Perforated viscus with history of gastric bypass  FEN: IV fluids/n.p.o. ID: Zosyn 9/8>> day 2; remdesivir 9/8 >>day 2 DVT: SCDs/she can have DVT prophylaxis from our standpoint Follow-up: TBD  Plan:  Her WBC, CRP are both trending up.  RR is up also; BP is stable.  Continue current conservative medical management for now. Continue PPI,  If her  symptoms progress she may need exploratory laparotomy, but we will try and hold off on that for now.     LOS: 1 day    Kendra [name] 10/19/2019 Please see Amion

## 2019-10-19 NOTE — Progress Notes (Signed)
PROGRESS NOTE    Kendra [name]  WOE:[id number] DOB: Feb 25, 1975 DOA: 10/17/2019 PCP: System, Pcp Not In    Brief Narrative:  44 year old lady with history of gastric bypass in 2009, no chronic medical issues, unvaccinated against COVID-19 presented to the ER with abdominal pain and Covid-like symptoms.  She has been having symptoms for about 2 weeks now.  Generalized weakness, body ache cough and shortness of breath.  Patient noted melanotic stool at home and started having epigastric abdominal pain yesterday so came to the ER. In the emergency room, temperature 100.6.  Blood pressure stable.  On room air.  Melanotic stool.  Hemoglobin 8.2.  COVID-19 positive.  Chest x-ray with bibasilar groundglass airspace disease. CT scan of the abdomen with multiple area of free air, organized collection posterior to the uterus of 5 cm.  No baseline labs available. Patient is started on Protonix infusion, surgical consult was done, currently on supportive treatment with IV antibiotic and IV fluid with Protonix infusion.   Assessment & Plan:   Principal Problem:   Perforated viscus Active Problems:   History of gastric bypass   SIRS (systemic inflammatory response syndrome) (HCC)   Pneumonia due to COVID-19 virus   Anemia  Suspected perforated bleeding anastomotic ulcer: Surgery on board, N.p.o., IV fluids, IV Zosyn, adequate pain medications. Transfusion to keep hemoglobin more than 8. Further management as per surgery.  Anticipating conservative management with close follow-up with serial abdominal exam and hemoglobin.  COVID-19 pneumonia: Patient is on room air.  No indication for steroids, will avoid in active bleeding. Patient is on Zosyn for intra-abdominal prophylaxis.  We will continue. Given significant evidence of pneumonia on bilateral lungs, will treat with remdesivir for 5 days. Will need close monitoring and inflammatory markers monitoring. Chest physiotherapy, incentive  spirometry. Inflammatory markers are trending up, oxygen requirement is stable.  Will need close monitoring. COVID-19 Labs  Recent Labs    10/18/19 0540 10/18/19 0739 10/19/19 0404  DDIMER QUESTIONABLE RESULTS, RECOMMEND RECOLLECT TO VERIFY 10.03* >20.00*  FERRITIN 17 17  --   LDH QUESTIONABLE RESULTS, RECOMMEND RECOLLECT TO VERIFY 205*  --   CRP 20.6* 21.5* 32.9*    Lab Results  Component Value Date   SARSCOV2NAA POSITIVE (A) 10/17/2019   SpO2: 100 %  Acute blood loss anemia: Baseline hemoglobin unknown.  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   Hemoglobin 8.2 -6.2 -2 units of PRBC -9.5  Continue close monitoring, recheck levels tomorrow morning.  DVT prophylaxis: SCDs Start: 10/18/19 0037   Code Status: Full code Family Communication: sister called, unable to pick up the phone. Patient herself will update.  Disposition Plan: Status is: Inpatient  Remains inpatient appropriate because:IV treatments appropriate due to intensity of illness or inability to take PO and Inpatient level of care appropriate due to severity of illness   Dispo: The patient is from: Home              Anticipated d/c is to: Home              Anticipated d/c date is: 3 days              Patient currently is not medically stable to d/c.         Consultants:   Surgery  Procedures:   None  Antimicrobials:  Anti-infectives (From admission, onward)   Start     Dose/Rate Route Frequency Ordered Stop   10/19/19 1000  remdesivir 100 mg in sodium chloride 0.9 % 100  mL IVPB       "Followed by" Linked Group Details   100 mg 200 mL/hr over 30 Minutes Intravenous Daily 10/18/19 0515 10/23/19 0959   10/18/19 0600  remdesivir 200 mg in sodium chloride 0.9% 250 mL IVPB       "Followed by" Linked Group Details   200 mg 580 mL/hr over 30 Minutes Intravenous Once 10/18/19 0515 10/18/19 0633   10/18/19 0600  piperacillin-tazobactam (ZOSYN) IVPB 3.375 g        3.375 g 12.5 mL/hr over  240 Minutes Intravenous Every 8 hours 10/18/19 0520     10/17/19 2330  piperacillin-tazobactam (ZOSYN) IVPB 3.375 g        3.375 g 100 mL/hr over 30 Minutes Intravenous  Once 10/17/19 2329 10/18/19 0125         Subjective: Patient seen and examined.  Intermittent abdominal pain when pain medicine wears off.  When she is not hurting, able to take deep breaths with no breathing problems.  When it hurts it hurts everywhere. No bowel movement or flatus.  Urinating well.  Feels really thirsty and wants some liquids.  I defer this to surgery.  Objective: Vitals:   10/18/19 1616 10/18/19 1856 10/18/19 2240 10/19/19 0353  BP: 120/77 125/82 116/71 129/77  Pulse: 74 87 88 91  Resp: (!) 21 17 18 15   Temp: 98.9 F (37.2 C) 98.2 F (36.8 C) 99.4 F (37.4 C) 99.2 F (37.3 C)  TempSrc: Oral Oral Oral Oral  SpO2: 99%  100% 100%    Intake/Output Summary (Last 24 hours) at 10/19/2019 1257 Last data filed at 10/19/2019 0535 Gross per 24 hour  Intake 1802.06 ml  Output --  Net 1802.06 ml   There were no vitals filed for this visit.  Examination:  General exam: Appears calm and comfortable  Chronically sick looking.  On room air.  Not in any distress. Respiratory system: Clear to auscultation. Respiratory effort normal.  No added sounds. Cardiovascular system: S1 & S2 heard, RRR. No JVD, murmurs, rubs, gallops or clicks. No pedal edema. Gastrointestinal system: Abdomen soft.  Bowel sounds are present.  There is mild tenderness diffuse with no rigidity or guarding. Central nervous system: Alert and oriented. No focal neurological deficits. Extremities: Symmetric 5 x 5 power. Skin: No rashes, lesions or ulcers Psychiatry: Judgement and insight appear normal. Mood & affect appropriate.     Data Reviewed: I have personally reviewed following labs and imaging studies  CBC: Recent Labs  Lab 10/17/19 1723 10/18/19 0540 10/18/19 0739 10/18/19 2051 10/19/19 0404  WBC 12.2* QUESTIONABLE  RESULTS, RECOMMEND RECOLLECT TO VERIFY 13.6*  --  16.5*  NEUTROABS  --   --   --   --  15.0*  HGB 8.2* QUESTIONABLE RESULTS, RECOMMEND RECOLLECT TO VERIFY 6.2* 9.2* 9.4*  HCT 25.6* QUESTIONABLE RESULTS, RECOMMEND RECOLLECT TO VERIFY 21.8* 31.3* 31.2*  MCV 75.5* QUESTIONABLE RESULTS, RECOMMEND RECOLLECT TO VERIFY 67.1*  --  74.5*  PLT 949* QUESTIONABLE RESULTS, RECOMMEND RECOLLECT TO VERIFY 721*  --  448*   Basic Metabolic Panel: Recent Labs  Lab 10/17/19 1723 10/19/19 0404  NA 136 140  K 4.4 3.9  CL 99 107  CO2 18* 22  GLUCOSE 118* 104*  BUN 16 12  CREATININE 0.69 0.59  CALCIUM 9.1 8.4*   GFR: CrCl cannot be calculated (Unknown ideal weight.). Liver Function Tests: Recent Labs  Lab 10/17/19 1723 10/19/19 0404  AST 28 18  ALT 10 9  ALKPHOS 37* 40  BILITOT 0.7  1.0  PROT 9.2* 7.6  ALBUMIN 3.9 2.8*   Recent Labs  Lab 10/17/19 1723  LIPASE 20   No results for input(s): AMMONIA in the last 168 hours. Coagulation Profile: Recent Labs  Lab 10/18/19 0040  INR 1.4*   Cardiac Enzymes: No results for input(s): CKTOTAL, CKMB, CKMBINDEX, TROPONINI in the last 168 hours. BNP (last 3 results) No results for input(s): PROBNP in the last 8760 hours. HbA1C: No results for input(s): HGBA1C in the last 72 hours. CBG: No results for input(s): GLUCAP in the last 168 hours. Lipid Profile: No results for input(s): CHOL, HDL, LDLCALC, TRIG, CHOLHDL, LDLDIRECT in the last 72 hours. Thyroid Function Tests: No results for input(s): TSH, T4TOTAL, FREET4, T3FREE, THYROIDAB in the last 72 hours. Anemia Panel: Recent Labs    10/18/19 0540 10/18/19 0739  VITAMINB12 216 209  FOLATE 10.7 13.5  FERRITIN 17 17  TIBC 369 340  IRON 17* 10*  RETICCTPCT QUESTIONABLE RESULTS, RECOMMEND RECOLLECT TO VERIFY 1.0   Sepsis Labs: Recent Labs  Lab 10/18/19 0540 10/18/19 0739  PROCALCITON QUESTIONABLE RESULTS, RECOMMEND RECOLLECT TO VERIFY 2.13  LATICACIDVEN 1.1 0.9    Recent Results  (from the past 240 hour(s))  SARS Coronavirus 2 by RT PCR (hospital order, performed in [place] hospital lab) Nasopharyngeal Nasopharyngeal Swab     Status: Abnormal   Collection Time: 10/17/19  5:26 PM   Specimen: Nasopharyngeal Swab  Result Value Ref Range Status   SARS Coronavirus 2 POSITIVE (A) NEGATIVE Final    Comment: RESULT CALLED TO, READ BACK BY AND VERIFIED WITH: I.HODGES AT 1950 ON 10/17/19 BY N.THOMPSON (NOTE) SARS-CoV-2 target nucleic acids are DETECTED  SARS-CoV-2 RNA is generally detectable in upper respiratory specimens  during the acute phase of infection.  Positive results are indicative  of the presence of the identified virus, but do not rule out bacterial infection or co-infection with other pathogens not detected by the test.  Clinical correlation with patient history and  other diagnostic information is necessary to determine patient infection status.  The expected result is negative.  Fact Sheet for Patients:   StrictlyIdeas.no   Fact Sheet for Healthcare Providers:   BankingDealers.co.za    This test is not yet approved or cleared by the [place] FDA and  has been authorized for detection and/or diagnosis of SARS-CoV-2 by FDA under an Emergency Use Authorization (EUA).  This EUA will remain in effect (meaning t his test can be used) for the duration of  the COVID-19 declaration under Section 564(b)(1) of the Act, 21 U.S.C. section 360-bbb-3(b)(1), unless the authorization is terminated or revoked sooner.  Performed at [facility], [place] [address] [place], [place] [address]   Culture, blood (Routine X 2) w Reflex to ID Panel     Status: None (Preliminary result)   Collection Time: 10/18/19  5:30 AM   Specimen: BLOOD  Result Value Ref Range Status   Specimen Description   Final    BLOOD RIGHT ANTECUBITAL Performed at [facility] [address],  [place], [place] [address]    Special Requests   Final    BOTTLES DRAWN AEROBIC AND ANAEROBIC Blood Culture adequate volume Performed at [facility] [address], [place], [place] [address]    Culture   Final    NO GROWTH 1 DAY Performed at [facility], [place] [address] [place], [place] [address]    Report Status PENDING  Incomplete  Culture, blood (Routine  X 2) w Reflex to ID Panel     Status: None (Preliminary result)   Collection Time: 10/18/19  5:40 AM   Specimen: BLOOD RIGHT HAND  Result Value Ref Range Status   Specimen Description   Final    BLOOD RIGHT HAND Performed at [facility] [address], [place], [place] [address]    Special Requests   Final    BOTTLES DRAWN AEROBIC AND ANAEROBIC Blood Culture adequate volume Performed at [facility] [address] [place], [place] [address]    Culture   Final    NO GROWTH 1 DAY Performed at [facility], [place] [address] [place], [place] [address]    Report Status PENDING  Incomplete         Radiology Studies: DG Chest 2 View  Result Date: 10/17/2019 CLINICAL DATA:  Epigastric pain EXAM: CHEST - 2 VIEW COMPARISON:  None. FINDINGS: Frontal and lateral views of the chest demonstrate an unremarkable cardiac silhouette. Multifocal bibasilar ground-glass airspace disease could reflect atypical infection such as COVID-19. No effusion or pneumothorax. Surgical clips are seen at the gastroesophageal junction. Lucency beneath the left hemidiaphragm, with gas fluid level noted on lateral view, could reflect a distended stomach. Please correlate for any peritoneal signs that would suggest free gas. If further evaluation is desired, decubitus views of the abdomen or CT of the abdomen could be considered. IMPRESSION: 1. Bibasilar ground-glass airspace disease, which could reflect multifocal pneumonia versus edema. Pattern is consistent with COVID-19. 2. Nonspecific  lucency left upper quadrant, which may reflect a distended gastric fundus. If there is concern for free intraperitoneal gas, decubitus radiographs or CT could be considered. Critical Value/emergent results were called by telephone at the time of interpretation on 10/17/2019 at 6:36 pm to provider Dr. Tomi Bamberger, who verbally acknowledged these results. Electronically Signed   By: Randa Ngo M.D.   On: 10/17/2019 18:36   DG Abd 1 View  Result Date: 10/18/2019 CLINICAL DATA:  Reported viscus perforation EXAM: ABDOMEN - 1 VIEW COMPARISON:  October 17, 2019 CT abdomen and pelvis FINDINGS: Postoperative changes noted at multiple sites in the abdomen and pelvis. Pneumoperitoneum less well seen on supine examination compared to CT. No appreciable bowel dilatation or air-fluid levels. No bone lesions. 1.7 x 1.3 cm calcification in the left upper abdomen may represent a peripheral arterial aneurysm, unchanged from 1 day prior CT examination. IMPRESSION: Pneumoperitoneum is better seen on recent CT. No bowel obstruction evident. Extensive postoperative change at multiple sites noted. Electronically Signed   By: Lowella Grip III M.D.   On: 10/18/2019 11:37   CT ABDOMEN PELVIS W CONTRAST  Result Date: 10/17/2019 CLINICAL DATA:  Abdominal pain and diarrhea.  Generalized weakness. EXAM: CT ABDOMEN AND PELVIS WITH CONTRAST TECHNIQUE: Multidetector CT imaging of the abdomen and pelvis was performed using the standard protocol following bolus administration of intravenous contrast. CONTRAST:  197mL OMNIPAQUE IOHEXOL 350 MG/ML SOLN COMPARISON:  Chest radiograph earlier this day. FINDINGS: Lower chest: Peripheral ground-glass and consolidative opacities in both lower lobes. Minimal pleural thickening without effusion. Hepatobiliary: No focal liver abnormality is seen. No gallstones, gallbladder wall thickening, or biliary dilatation. Air tracking about the left lobe of the liver appears to be related to free intra-abdominal  air rather than biliary or portal venous gas. Pancreas: No ductal dilatation or inflammation. Spleen: Normal in size without focal abnormality. Adrenals/Urinary Tract: Normal adrenal glands. No hydronephrosis or perinephric edema. Homogeneous renal enhancement with symmetric excretion  on delayed phase imaging. Urinary bladder is physiologically distended without wall thickening. Stomach/Bowel: Multiple foci of free air scattered throughout the upper abdomen as well as lower anterior mesentery and lower anterior abdominal cavity. Site of perforation is not definitively delineated. Gastric bypass anatomy. Bowel evaluation is limited in the absence of enteric contrast and paucity of intra-abdominal fat. The excluded gastric remnant is minimally fluid-filled but nondilated. There is minimal wall thickening of the proximal Roux limb. The jejunal anastomosis in this grossly normal. There is no small bowel obstruction or inflammation. Small bowel loop extends into an umbilical hernia but no evidence of bowel wall thickening or proximal dilatation. Pelvic bowel loops are fluid-filled but nondilated. Colon is difficult to define. There is liquid stool in the proximal colon. Normal appendix tentatively visualized. There is transverse colonic tortuosity. The majority of the colon is completely decompressed. Vascular/Lymphatic: Normal caliber abdominal aorta. Patent portal vein. No evidence of portal venous or mesenteric gas. No gross abdominopelvic adenopathy. Reproductive: Retroverted uterus with probable posterior fundal fibroid. No evidence of adnexal mass. Other: Multiple foci of free air throughout the abdomen. Inferior seen throughout the mesentery, anteriorly as well as tracking into the upper abdomen. Small amount of free fluid in the pelvis. There may be an organized collection posterior to the uterus measuring 4.9 cm, series 2, image 63. Musculoskeletal: Degenerative disc disease at L5-S1. There are no acute or  suspicious osseous abnormalities. IMPRESSION: 1. Multiple foci of free air throughout the abdomen consistent with perforated viscus. Site of perforation is not definitively delineated, however there is minimal wall thickening of the proximal Roux limb. In the setting of gastric bypass, perforated marginal ulcer is considered. There is no other site of bowel inflammation. 2. Small amount of free fluid in the pelvis. There may be an organized collection posterior to the uterus measuring 4.9 cm. 3. Peripheral ground-glass and consolidative opacities in both lower lobes, consistent with COVID pneumonia. Critical Value/emergent results were called by telephone at the time of interpretation on 10/17/2019 at 11:26 pm to Dr Dorie Rank , who verbally acknowledged these results. Electronically Signed   By: Keith Rake M.D.   On: 10/17/2019 23:28        Scheduled Meds: . sodium chloride   Intravenous Once   Continuous Infusions: . pantoprozole (PROTONIX) infusion 8 mg/hr (10/19/19 4315)  . piperacillin-tazobactam (ZOSYN)  IV 3.375 g (10/19/19 1239)  . remdesivir 100 mg in NS 100 mL 100 mg (10/19/19 0935)     LOS: 1 day    Time spent: 30 minutes    Barb Merino, MD [facility] Pager [phone]

## 2019-10-19 NOTE — Plan of Care (Signed)

## 2019-10-20 ENCOUNTER — Inpatient Hospital Stay (HOSPITAL_COMMUNITY): Payer: Medicaid - Out of State

## 2019-10-20 ENCOUNTER — Encounter (HOSPITAL_COMMUNITY): Payer: Self-pay | Admitting: Internal Medicine

## 2019-10-20 ENCOUNTER — Inpatient Hospital Stay: Payer: Self-pay

## 2019-10-20 DIAGNOSIS — U071 COVID-19: Principal | ICD-10-CM

## 2019-10-20 DIAGNOSIS — J1282 Pneumonia due to coronavirus disease 2019: Secondary | ICD-10-CM

## 2019-10-20 DIAGNOSIS — D649 Anemia, unspecified: Secondary | ICD-10-CM

## 2019-10-20 LAB — COMPREHENSIVE METABOLIC PANEL
ALT: 9 U/L (ref 0–44)
AST: 17 U/L (ref 15–41)
Albumin: 2.5 g/dL — ABNORMAL LOW (ref 3.5–5.0)
Alkaline Phosphatase: 42 U/L (ref 38–126)
Anion gap: 12 (ref 5–15)
BUN: 13 mg/dL (ref 6–20)
CO2: 24 mmol/L (ref 22–32)
Calcium: 8.1 mg/dL — ABNORMAL LOW (ref 8.9–10.3)
Chloride: 105 mmol/L (ref 98–111)
Creatinine, Ser: 0.6 mg/dL (ref 0.44–1.00)
GFR calc Af Amer: >60 mL/min (ref >60)
GFR calc non Af Amer: >60 mL/min (ref >60)
Glucose, Bld: 93 mg/dL (ref 70–99)
Potassium: 3.2 mmol/L — ABNORMAL LOW (ref 3.5–5.1)
Sodium: 141 mmol/L (ref 135–145)
Total Bilirubin: 0.8 mg/dL (ref 0.3–1.2)
Total Protein: 6.9 g/dL (ref 6.5–8.1)

## 2019-10-20 LAB — CBC WITH DIFFERENTIAL/PLATELET
Abs Immature Granulocytes: 0.13 10*3/uL — ABNORMAL HIGH (ref 0.00–0.07)
Basophils Absolute: 0 10*3/uL (ref 0.0–0.1)
Basophils Relative: 0 %
Eosinophils Absolute: 0.1 10*3/uL (ref 0.0–0.5)
Eosinophils Relative: 1 %
HCT: 27.8 % — ABNORMAL LOW (ref 36.0–46.0)
Hemoglobin: 8.7 g/dL — ABNORMAL LOW (ref 12.0–15.0)
Immature Granulocytes: 1 %
Lymphocytes Relative: 8 %
Lymphs Abs: 1.2 10*3/uL (ref 0.7–4.0)
MCH: 23.3 pg — ABNORMAL LOW (ref 26.0–34.0)
MCHC: 31.3 g/dL (ref 30.0–36.0)
MCV: 74.3 fL — ABNORMAL LOW (ref 80.0–100.0)
Monocytes Absolute: 0.2 10*3/uL (ref 0.1–1.0)
Monocytes Relative: 1 %
Neutro Abs: 13.6 10*3/uL — ABNORMAL HIGH (ref 1.7–7.7)
Neutrophils Relative %: 89 %
Platelets: 769 10*3/uL — ABNORMAL HIGH (ref 150–400)
RBC: 3.74 MIL/uL — ABNORMAL LOW (ref 3.87–5.11)
RDW: 30.8 % — ABNORMAL HIGH (ref 11.5–15.5)
WBC: 15.2 10*3/uL — ABNORMAL HIGH (ref 4.0–10.5)
nRBC: 0 % (ref 0.0–0.2)

## 2019-10-20 LAB — PREALBUMIN: Prealbumin: <5 mg/dL — ABNORMAL LOW (ref 18–38)

## 2019-10-20 LAB — C-REACTIVE PROTEIN: CRP: 31.2 mg/dL — ABNORMAL HIGH (ref <1.0)

## 2019-10-20 LAB — D-DIMER, QUANTITATIVE: D-Dimer, Quant: 17.13 ug/mL-FEU — ABNORMAL HIGH (ref 0.00–0.50)

## 2019-10-20 MED ORDER — PROMETHAZINE HCL 25 MG/ML IJ SOLN
12.5000 mg | Freq: Four times a day (QID) | INTRAMUSCULAR | Status: DC | PRN
  Administered 2019-10-22 – 2019-10-27 (×4): 12.5 mg via INTRAVENOUS
  Filled 2019-10-20 (×4): qty 1

## 2019-10-20 MED ORDER — POTASSIUM CHLORIDE 10 MEQ/100ML IV SOLN
10.0000 meq | Freq: Once | INTRAVENOUS | Status: AC
  Administered 2019-10-20: 10 meq via INTRAVENOUS
  Filled 2019-10-20: qty 100

## 2019-10-20 MED ORDER — IOHEXOL 9 MG/ML PO SOLN
ORAL | Status: AC
  Filled 2019-10-20: qty 1000

## 2019-10-20 MED ORDER — IOHEXOL 9 MG/ML PO SOLN
500.0000 mL | ORAL | Status: AC
  Administered 2019-10-20 (×2): 500 mL via ORAL

## 2019-10-20 MED ORDER — POTASSIUM CHLORIDE 10 MEQ/100ML IV SOLN
10.0000 meq | INTRAVENOUS | Status: DC
  Administered 2019-10-20 (×4): 10 meq via INTRAVENOUS
  Filled 2019-10-20 (×3): qty 100

## 2019-10-20 MED ORDER — PANTOPRAZOLE SODIUM 40 MG IV SOLR
40.0000 mg | Freq: Two times a day (BID) | INTRAVENOUS | Status: DC
  Administered 2019-10-20 – 2019-10-29 (×18): 40 mg via INTRAVENOUS
  Filled 2019-10-20 (×18): qty 40

## 2019-10-20 NOTE — Progress Notes (Signed)
Pt continues with abdominal pain radiating to right back up arm and neck. She has been given 4 doses of IV pain medication to include higher dose with last administration. (see MAR)  Pt was also without IV access for a few hours as all 3 IV's had infiltrated. Rounding MD made aware as pt had run of potassium and antibiotic due. Also needing to restart Protonix gtt.  IV consulted for IV access as pt is difficult stick. Protonix gtt changed to 40mg  IV BID.  Pt aware of changes being made to medication regimen d/t limited IV access; verbalizes understanding.   Radiolgy also Advertising copywriter of procedure cancelled for today and scheduled for tomorrow; unknown time.   Pt also made aware of this change, disappointed in not being able to eat/drink but verbalizes understanding of being kept NPO.

## 2019-10-20 NOTE — Progress Notes (Signed)
PROGRESS NOTE    Kendra [name]  ONG:[id number] DOB: November 30, 1975 DOA: 10/17/2019 PCP: System, Pcp Not In    Brief Narrative:  44 year old lady with history of gastric bypass in 2009, no chronic medical issues, unvaccinated against COVID-19 presented to the ER with abdominal pain and Covid-like symptoms.  She has been having symptoms for about 2 weeks now.  Generalized weakness, body ache cough and shortness of breath.  Patient noted melanotic stool at home and started having epigastric abdominal pain yesterday so came to the ER. In the emergency room, temperature 100.6.  Blood pressure stable.  On room air.  Melanotic stool.  Hemoglobin 8.2.  COVID-19 positive.  Chest x-ray with bibasilar groundglass airspace disease. CT scan of the abdomen with multiple area of free air, organized collection posterior to the uterus of 5 cm.  No baseline labs available. Patient is started on Protonix infusion, surgical consult was done, currently on supportive treatment with IV antibiotic and IV fluid with Protonix infusion.   Assessment & Plan:   Principal Problem:   Perforated viscus Active Problems:   History of gastric bypass   SIRS (systemic inflammatory response syndrome) (HCC)   Pneumonia due to COVID-19 virus   Anemia  Suspected perforated bleeding anastomotic ulcer: Surgery on board, N.p.o., IV fluids, IV Zosyn, adequate pain medications. Transfusion to keep hemoglobin more than 8. Further management as per surgery.  It appears that plan is for a CT scan today.  Pneumonia due to COVID-19: Patient does not appear to be very symptomatic from the same.  Saturating normal on room air.  No indication for steroids.  She is getting remdesivir.  Today being day 3. D-dimer noted to be 17.13 today improved from greater than 20.  CRP significantly elevated at 31 Some of these markers elevated also due to her abdominal issues. Since she seems to be otherwise stable from a respiratory standpoint do not  suspect venous thromboembolism at this time.  Continue with incentive spirometry mobilization.  COVID-19 Labs  Recent Labs    10/18/19 0540 10/18/19 0540 10/18/19 0739 10/19/19 0404 10/20/19 0412  DDIMER QUESTIONABLE RESULTS, RECOMMEND RECOLLECT TO VERIFY   < > 10.03* >20.00* 17.13*  FERRITIN 17  --  17  --   --   LDH QUESTIONABLE RESULTS, RECOMMEND RECOLLECT TO VERIFY  --  205*  --   --   CRP 20.6*   < > 21.5* 32.9* 31.2*   < > = values in this interval not displayed.    Acute blood loss anemia Baseline hemoglobin unknown.  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   Hemoglobin was noted to be 6.2 on 9/8.  She was transfused 2 units of blood.  Improved.  Continue to monitor.     DVT prophylaxis: SCDs Code Status: Full code Family Communication: Discussed with the patient Disposition Plan: Return home when improved  Status is: Inpatient  Remains inpatient appropriate because:IV treatments appropriate due to intensity of illness or inability to take PO and Inpatient level of care appropriate due to severity of illness   Dispo:  Patient From: Home  Planned Disposition: Home  Expected discharge date: 10/22/19  Medically stable for discharge: No        Consultants:   Surgery  Procedures:   None  Antimicrobials:  Anti-infectives (From admission, onward)   Start     Dose/Rate Route Frequency Ordered Stop   10/19/19 1000  remdesivir 100 mg in sodium chloride 0.9 % 100 mL IVPB       "  Followed by" Linked Group Details   100 mg 200 mL/hr over 30 Minutes Intravenous Daily 10/18/19 0515 10/23/19 0959   10/18/19 0600  remdesivir 200 mg in sodium chloride 0.9% 250 mL IVPB       "Followed by" Linked Group Details   200 mg 580 mL/hr over 30 Minutes Intravenous Once 10/18/19 0515 10/18/19 0633   10/18/19 0600  piperacillin-tazobactam (ZOSYN) IVPB 3.375 g        3.375 g 12.5 mL/hr over 240 Minutes Intravenous Every 8 hours 10/18/19 0520     10/17/19 2330   piperacillin-tazobactam (ZOSYN) IVPB 3.375 g        3.375 g 100 mL/hr over 30 Minutes Intravenous  Once 10/17/19 2329 10/18/19 0125         Subjective: Patient complains of abdominal pain with some nausea.  No vomiting.  Shortness of breath is improved.  Occasional dry cough.  Objective: Vitals:   10/19/19 0353 10/19/19 1343 10/19/19 2123 10/20/19 0406  BP: 129/77 117/78 124/78 116/71  Pulse: 91 81 79 79  Resp: 15 16 16 19   Temp: 99.2 F (37.3 C) 98.9 F (37.2 C) 99.2 F (37.3 C) 98.7 F (37.1 C)  TempSrc: Oral Oral Oral   SpO2: 100% 100% 97% 100%    Intake/Output Summary (Last 24 hours) at 10/20/2019 1303 Last data filed at 10/19/2019 1500 Gross per 24 hour  Intake 272.09 ml  Output --  Net 272.09 ml   There were no vitals filed for this visit.  Examination:  General appearance: Awake alert.  In no distress Resp: Normal effort.  Few crackles at the bases.  No wheezing or rhonchi. Cardio: S1-S2 is normal regular.  No S3-S4.  No rubs murmurs or bruit GI: Abdomen is soft tender diffusely.  Some guarding is appreciated.  No bowel sounds heard.  Extremities: No edema.  Full range of motion of lower extremities. Neurologic: Alert and oriented x3.  No focal neurological deficits.    Data Reviewed: I have personally reviewed following labs and imaging studies  CBC: Recent Labs  Lab 10/17/19 1723 10/17/19 1723 10/18/19 0540 10/18/19 0739 10/18/19 2051 10/19/19 0404 10/20/19 0412  WBC 12.2*  --  QUESTIONABLE RESULTS, RECOMMEND RECOLLECT TO VERIFY 13.6*  --  16.5* 15.2*  NEUTROABS  --   --   --   --   --  15.0* 13.6*  HGB 8.2*   < > QUESTIONABLE RESULTS, RECOMMEND RECOLLECT TO VERIFY 6.2* 9.2* 9.4* 8.7*  HCT 25.6*   < > QUESTIONABLE RESULTS, RECOMMEND RECOLLECT TO VERIFY 21.8* 31.3* 31.2* 27.8*  MCV 75.5*  --  QUESTIONABLE RESULTS, RECOMMEND RECOLLECT TO VERIFY 67.1*  --  74.5* 74.3*  PLT 949*  --  QUESTIONABLE RESULTS, RECOMMEND RECOLLECT TO VERIFY 721*  --  602* 769*    < > = values in this interval not displayed.   Basic Metabolic Panel: Recent Labs  Lab 10/17/19 1723 10/19/19 0404 10/20/19 0412  NA 136 140 141  K 4.4 3.9 3.2*  CL 99 107 105  CO2 18* 22 24  GLUCOSE 118* 104* 93  BUN 16 12 13   CREATININE 0.69 0.59 0.60  CALCIUM 9.1 8.4* 8.1*   GFR: CrCl cannot be calculated (Unknown ideal weight.). Liver Function Tests: Recent Labs  Lab 10/17/19 1723 10/19/19 0404 10/20/19 0412  AST 28 18 17   ALT 10 9 9   ALKPHOS 37* 40 42  BILITOT 0.7 1.0 0.8  PROT 9.2* 7.6 6.9  ALBUMIN 3.9 2.8* 2.5*   Recent Labs  Lab 10/17/19 1723  LIPASE 20   Coagulation Profile: Recent Labs  Lab 10/18/19 0040  INR 1.4*   Anemia Panel: Recent Labs    10/18/19 0540 10/18/19 0739  VITAMINB12 216 209  FOLATE 10.7 13.5  FERRITIN 17 17  TIBC 369 340  IRON 17* 10*  RETICCTPCT QUESTIONABLE RESULTS, RECOMMEND RECOLLECT TO VERIFY 1.0   Sepsis Labs: Recent Labs  Lab 10/18/19 0540 10/18/19 0739  PROCALCITON QUESTIONABLE RESULTS, RECOMMEND RECOLLECT TO VERIFY 2.13  LATICACIDVEN 1.1 0.9    Recent Results (from the past 240 hour(s))  SARS Coronavirus 2 by RT PCR (hospital order, performed in [place] hospital lab) Nasopharyngeal Nasopharyngeal Swab     Status: Abnormal   Collection Time: 10/17/19  5:26 PM   Specimen: Nasopharyngeal Swab  Result Value Ref Range Status   SARS Coronavirus 2 POSITIVE (A) NEGATIVE Final    Comment: RESULT CALLED TO, READ BACK BY AND VERIFIED WITH: I.HODGES AT 1950 ON 10/17/19 BY N.THOMPSON (NOTE) SARS-CoV-2 target nucleic acids are DETECTED  SARS-CoV-2 RNA is generally detectable in upper respiratory specimens  during the acute phase of infection.  Positive results are indicative  of the presence of the identified virus, but do not rule out bacterial infection or co-infection with other pathogens not detected by the test.  Clinical correlation with patient history and  other diagnostic information is necessary to  determine patient infection status.  The expected result is negative.  Fact Sheet for Patients:   StrictlyIdeas.no   Fact Sheet for Healthcare Providers:   BankingDealers.co.za    This test is not yet approved or cleared by the [place] FDA and  has been authorized for detection and/or diagnosis of SARS-CoV-2 by FDA under an Emergency Use Authorization (EUA).  This EUA will remain in effect (meaning t his test can be used) for the duration of  the COVID-19 declaration under Section 564(b)(1) of the Act, 21 U.S.C. section 360-bbb-3(b)(1), unless the authorization is terminated or revoked sooner.  Performed at [facility], [place] [address], [place], [place] [address]   Culture, blood (Routine X 2) w Reflex to ID Panel     Status: None (Preliminary result)   Collection Time: 10/18/19  5:30 AM   Specimen: BLOOD  Result Value Ref Range Status   Specimen Description   Final    BLOOD RIGHT ANTECUBITAL Performed at [facility] [address] [place], [place] [address]    Special Requests   Final    BOTTLES DRAWN AEROBIC AND ANAEROBIC Blood Culture adequate volume Performed at [facility] [address] [place], [place] [address]    Culture   Final    NO GROWTH 2 DAYS Performed at [facility] [address] [place], [place] [address]    Report Status PENDING  Incomplete  Culture, blood (Routine X 2) w Reflex to ID Panel     Status: None (Preliminary result)   Collection Time: 10/18/19  5:40 AM   Specimen: BLOOD RIGHT HAND  Result Value Ref Range Status   Specimen Description   Final    BLOOD RIGHT HAND Performed at [facility] [address] [place], [place] [address]    Special Requests   Final    BOTTLES DRAWN AEROBIC AND ANAEROBIC Blood Culture adequate volume Performed at [facility] [address],  [place],  [address]    Culture   Final    NO GROWTH 2 DAYS Performed  at [facility], [place] [address], [place], [place] [address]    Report Status PENDING  Incomplete         Radiology Studies:      Scheduled Meds: . sodium chloride   Intravenous Once  . iohexol       Continuous Infusions: . pantoprozole (PROTONIX) infusion 8 mg/hr (10/20/19 0542)  . piperacillin-tazobactam (ZOSYN)  IV 3.375 g (10/20/19 0542)  . remdesivir 100 mg in NS 100 mL 100 mg (10/20/19 0925)     LOS: 2 days        Bonnielee Haff, MD [facility]

## 2019-10-20 NOTE — Consult Note (Signed)
Chief Complaint: Patient was seen in consultation today for image guided drainage of left upper abdominal fluid collection Chief Complaint  Patient presents with  . Abdominal Pain  . Generalized Body Aches  . COVID SX    Referring Physician(s): Gerkin,T  Supervising Physician: Sandi Mariscal  Patient Status: [facility] - In-pt  History of Present Illness: Kendra [name] is a 44 y.o. female with hx gastric bypass 2009 as well as associated ulcers who presenetd to [facility] ED 9/7 with persistent weakness, abd pain, melena, cough, CP, dyspnea, diarrhea. CT A/P 9/7 revealed:  1. Multiple foci of free air throughout the abdomen consistent with perforated viscus. Site of perforation is not definitively delineated, however there is minimal wall thickening of the proximal Roux limb. In the setting of gastric bypass, perforated marginal ulcer is considered. There is no other site of bowel inflammation. 2. Small amount of free fluid in the pelvis. There may be an organized collection posterior to the uterus measuring 4.9 cm. 3. Peripheral ground-glass and consolidative opacities in both lower lobes, consistent with COVID pneumonia  Pt tested + for COVID- 19.(unvaccinated)  F/u CT A/P today revealed:   1. Increased organization of fluid collection beneath the LEFT hemidiaphragm is concerning for abscess formation. 2. No evidence active leaking of the water-soluble oral contrast at the gastrojejunostomy or elsewhere proximal GI tract. 3. Moderate volume intraperitoneal free air. Increased free air beneath the RIGHT hemidiaphragm. 4. Dense bibasilar consolidation with increasing atelectasis and Effusion.  Pt currently afebrile, WBC 15.2, hgb 8.7, plts 769k, K 3.2, creat 0.6, PT 16.5, INR 1.4; request now received from surgery for image guided drainage of LUQ fluid collection  History reviewed. No pertinent past medical history.  Past Surgical History:  Procedure Laterality Date  . GASTRIC  BYPASS      Allergies: Patient has no known allergies.  Medications: Prior to Admission medications   Not on File     History reviewed. No pertinent family history.  Social History   Socioeconomic History  . Marital status: Single    Spouse name: Not on file  . Number of children: Not on file  . Years of education: Not on file  . Highest education level: Not on file  Occupational History  . Not on file  Tobacco Use  . Smoking status: Never Smoker  . Smokeless tobacco: Never Used  Substance and Sexual Activity  . Alcohol use: Not Currently  . Drug use: Yes    Types: Marijuana  . Sexual activity: Not on file  Other Topics Concern  . Not on file  Social History Narrative  . Not on file   Social Determinants of Health   Financial Resource Strain:   . Difficulty of Paying Living Expenses: Not on file  Food Insecurity:   . Worried About Charity fundraiser in the Last Year: Not on file  . Ran Out of Food in the Last Year: Not on file  Transportation Needs:   . Lack of Transportation (Medical): Not on file  . Lack of Transportation (Non-Medical): Not on file  Physical Activity:   . Days of Exercise per Week: Not on file  . Minutes of Exercise per Session: Not on file  Stress:   . Feeling of Stress : Not on file  Social Connections:   . Frequency of Communication with Friends and Family: Not on file  . Frequency of Social Gatherings with Friends and Family: Not on file  . Attends Religious Services: Not on file  .  Active Member of Clubs or Organizations: Not on file  . Attends Archivist Meetings: Not on file  . Marital Status: Not on file      Review of Systems see above; + nausea  Vital Signs: BP 116/71 (BP Location: Left Arm)   Pulse 79   Temp 98.7 F (37.1 C)   Resp 19   LMP 10/10/2019   SpO2 100%   Physical Exam (per [facility] today)General appearance: Awake alert.  In no distress Resp: Normal effort.  Few crackles at the bases.  No wheezing  or rhonchi. Cardio: S1-S2 is normal regular.  No S3-S4.  No rubs murmurs or bruit GI: Abdomen is soft tender diffusely.  Some guarding is appreciated.  No bowel sounds heard.  Extremities: No edema.  Full range of motion of lower extremities. Neurologic: Alert and oriented x3.  No focal neurological deficits  Imaging: CT ABDOMEN WO CONTRAST  Result Date: 10/20/2019 CLINICAL DATA:  History of gastric bypass 2009. Intraperitoneal free air on CT 10/17/2019. EXAM: CT ABDOMEN WITHOUT CONTRAST TECHNIQUE: Multidetector CT imaging of the abdomen was performed following the standard protocol without IV contrast. Water-soluble oral contrast administered COMPARISON:  CT 10/17/2019 FINDINGS: Lower chest: Dense bibasilar consolidation and small effusions. Increased atelectasis and effusions compared to prior. Hepatobiliary: No focal lesion on noncontrast exam. Gallbladder normal. Pancreas: Normal pancreatic parenchymal intensity. No ductal dilatation or inflammation. Spleen: Normal spleen. Adrenals/urinary tract: Adrenal glands and kidneys are normal. Stomach/Bowel: Post gastric bypass anatomy. Water-soluble contrast is evident in the gastric pouch and proximal jejunum. No evidence of leak at the gastrojejunostomy. No evidence of leak elsewhere of the oral contrast. Some contrast does make it to the more distal mid small bowel in the upper pelvis. Again demonstrated moderate volume intraperitoneal free air. The volume free air is increased over the beneath the RIGHT hemidiaphragm (image 24/series 4/coronal). There is a fluid collection in the LEFT upper quadrant with an air-fluid level measuring 7.9 x 5.6 cm (image 13/2). This is more defined collection than on comparison exam. Vascular/Lymphatic: Abdominal aortic normal caliber. No retroperitoneal periportal lymphadenopathy. Musculoskeletal: No aggressive osseous lesion IMPRESSION: 1. Increased organization of fluid collection beneath the LEFT hemidiaphragm is  concerning for abscess formation. 2. No evidence active leaking of the water-soluble oral contrast at the gastrojejunostomy or elsewhere proximal GI tract. 3. Moderate volume intraperitoneal free air. Increased free air beneath the RIGHT hemidiaphragm. 4. Dense bibasilar consolidation with increasing atelectasis and effusion. These results will be called to the ordering clinician or representative by the Radiologist Assistant, and communication documented in the PACS or [employer]. Electronically Signed   By: Suzy Bouchard M.D.   On: 10/20/2019 12:15   DG Chest 2 View  Result Date: 10/17/2019 CLINICAL DATA:  Epigastric pain EXAM: CHEST - 2 VIEW COMPARISON:  None. FINDINGS: Frontal and lateral views of the chest demonstrate an unremarkable cardiac silhouette. Multifocal bibasilar ground-glass airspace disease could reflect atypical infection such as COVID-19. No effusion or pneumothorax. Surgical clips are seen at the gastroesophageal junction. Lucency beneath the left hemidiaphragm, with gas fluid level noted on lateral view, could reflect a distended stomach. Please correlate for any peritoneal signs that would suggest free gas. If further evaluation is desired, decubitus views of the abdomen or CT of the abdomen could be considered. IMPRESSION: 1. Bibasilar ground-glass airspace disease, which could reflect multifocal pneumonia versus edema. Pattern is consistent with COVID-19. 2. Nonspecific lucency left upper quadrant, which may reflect a distended gastric fundus. If there is  concern for free intraperitoneal gas, decubitus radiographs or CT could be considered. Critical Value/emergent results were called by telephone at the time of interpretation on 10/17/2019 at 6:36 pm to provider Dr. Tomi Bamberger, who verbally acknowledged these results. Electronically Signed   By: Randa Ngo M.D.   On: 10/17/2019 18:36   DG Abd 1 View  Result Date: 10/18/2019 CLINICAL DATA:  Reported viscus perforation EXAM:  ABDOMEN - 1 VIEW COMPARISON:  October 17, 2019 CT abdomen and pelvis FINDINGS: Postoperative changes noted at multiple sites in the abdomen and pelvis. Pneumoperitoneum less well seen on supine examination compared to CT. No appreciable bowel dilatation or air-fluid levels. No bone lesions. 1.7 x 1.3 cm calcification in the left upper abdomen may represent a peripheral arterial aneurysm, unchanged from 1 day prior CT examination. IMPRESSION: Pneumoperitoneum is better seen on recent CT. No bowel obstruction evident. Extensive postoperative change at multiple sites noted. Electronically Signed   By: Lowella Grip III M.D.   On: 10/18/2019 11:37   CT ABDOMEN PELVIS W CONTRAST  Result Date: 10/17/2019 CLINICAL DATA:  Abdominal pain and diarrhea.  Generalized weakness. EXAM: CT ABDOMEN AND PELVIS WITH CONTRAST TECHNIQUE: Multidetector CT imaging of the abdomen and pelvis was performed using the standard protocol following bolus administration of intravenous contrast. CONTRAST:  123mL OMNIPAQUE IOHEXOL 350 MG/ML SOLN COMPARISON:  Chest radiograph earlier this day. FINDINGS: Lower chest: Peripheral ground-glass and consolidative opacities in both lower lobes. Minimal pleural thickening without effusion. Hepatobiliary: No focal liver abnormality is seen. No gallstones, gallbladder wall thickening, or biliary dilatation. Air tracking about the left lobe of the liver appears to be related to free intra-abdominal air rather than biliary or portal venous gas. Pancreas: No ductal dilatation or inflammation. Spleen: Normal in size without focal abnormality. Adrenals/Urinary Tract: Normal adrenal glands. No hydronephrosis or perinephric edema. Homogeneous renal enhancement with symmetric excretion on delayed phase imaging. Urinary bladder is physiologically distended without wall thickening. Stomach/Bowel: Multiple foci of free air scattered throughout the upper abdomen as well as lower anterior mesentery and lower  anterior abdominal cavity. Site of perforation is not definitively delineated. Gastric bypass anatomy. Bowel evaluation is limited in the absence of enteric contrast and paucity of intra-abdominal fat. The excluded gastric remnant is minimally fluid-filled but nondilated. There is minimal wall thickening of the proximal Roux limb. The jejunal anastomosis in this grossly normal. There is no small bowel obstruction or inflammation. Small bowel loop extends into an umbilical hernia but no evidence of bowel wall thickening or proximal dilatation. Pelvic bowel loops are fluid-filled but nondilated. Colon is difficult to define. There is liquid stool in the proximal colon. Normal appendix tentatively visualized. There is transverse colonic tortuosity. The majority of the colon is completely decompressed. Vascular/Lymphatic: Normal caliber abdominal aorta. Patent portal vein. No evidence of portal venous or mesenteric gas. No gross abdominopelvic adenopathy. Reproductive: Retroverted uterus with probable posterior fundal fibroid. No evidence of adnexal mass. Other: Multiple foci of free air throughout the abdomen. Inferior seen throughout the mesentery, anteriorly as well as tracking into the upper abdomen. Small amount of free fluid in the pelvis. There may be an organized collection posterior to the uterus measuring 4.9 cm, series 2, image 63. Musculoskeletal: Degenerative disc disease at L5-S1. There are no acute or suspicious osseous abnormalities. IMPRESSION: 1. Multiple foci of free air throughout the abdomen consistent with perforated viscus. Site of perforation is not definitively delineated, however there is minimal wall thickening of the proximal Roux limb. In the setting  of gastric bypass, perforated marginal ulcer is considered. There is no other site of bowel inflammation. 2. Small amount of free fluid in the pelvis. There may be an organized collection posterior to the uterus measuring 4.9 cm. 3. Peripheral  ground-glass and consolidative opacities in both lower lobes, consistent with COVID pneumonia. Critical Value/emergent results were called by telephone at the time of interpretation on 10/17/2019 at 11:26 pm to Dr Dorie Rank , who verbally acknowledged these results. Electronically Signed   By: Keith Rake M.D.   On: 10/17/2019 23:28    Labs:  CBC: Recent Labs    10/18/19 0540 10/18/19 0540 10/18/19 0739 10/18/19 2051 10/19/19 0404 10/20/19 0412  WBC QUESTIONABLE RESULTS, RECOMMEND RECOLLECT TO VERIFY  --  13.6*  --  16.5* 15.2*  HGB QUESTIONABLE RESULTS, RECOMMEND RECOLLECT TO VERIFY   < > 6.2* 9.2* 9.4* 8.7*  HCT QUESTIONABLE RESULTS, RECOMMEND RECOLLECT TO VERIFY   < > 21.8* 31.3* 31.2* 27.8*  PLT QUESTIONABLE RESULTS, RECOMMEND RECOLLECT TO VERIFY  --  721*  --  602* 769*   < > = values in this interval not displayed.    COAGS: Recent Labs    10/18/19 0040  INR 1.4*    BMP: Recent Labs    10/17/19 1723 10/19/19 0404 10/20/19 0412  NA 136 140 141  K 4.4 3.9 3.2*  CL 99 107 105  CO2 18* 22 24  GLUCOSE 118* 104* 93  BUN 16 12 13   CALCIUM 9.1 8.4* 8.1*  CREATININE 0.69 0.59 0.60  GFRNONAA >60 >60 >60  GFRAA >60 >60 >60    LIVER FUNCTION TESTS: Recent Labs    10/17/19 1723 10/19/19 0404 10/20/19 0412  BILITOT 0.7 1.0 0.8  AST 28 18 17   ALT 10 9 9   ALKPHOS 37* 40 42  PROT 9.2* 7.6 6.9  ALBUMIN 3.9 2.8* 2.5*    TUMOR MARKERS: No results for input(s): AFPTM, CEA, CA199, CHROMGRNA in the last 8760 hours.  Assessment and Plan: 44 y.o. female with hx gastric bypass 2009 as well as associated ulcers who presenetd to [facility] ED 9/7 with persistent weakness, abd pain, melena, cough, CP, dyspnea, diarrhea. CT A/P 9/7 revealed:  1. Multiple foci of free air throughout the abdomen consistent with perforated viscus. Site of perforation is not definitively delineated, however there is minimal wall thickening of the proximal Roux limb. In the setting of gastric  bypass, perforated marginal ulcer is considered. There is no other site of bowel inflammation. 2. Small amount of free fluid in the pelvis. There may be an organized collection posterior to the uterus measuring 4.9 cm. 3. Peripheral ground-glass and consolidative opacities in both lower lobes, consistent with COVID pneumonia  Pt tested + for COVID- 19.(unvaccinated)  F/u CT A/P today revealed:   1. Increased organization of fluid collection beneath the LEFT hemidiaphragm is concerning for abscess formation. 2. No evidence active leaking of the water-soluble oral contrast at the gastrojejunostomy or elsewhere proximal GI tract. 3. Moderate volume intraperitoneal free air. Increased free air beneath the RIGHT hemidiaphragm. 4. Dense bibasilar consolidation with increasing atelectasis and Effusion.  Pt currently afebrile, WBC 15.2, hgb 8.7, plts 769k, K 3.2, creat 0.6, PT 16.5, INR 1.4; request now received from surgery for image guided drainage of LUQ fluid collection. Imaging studies have been reviewed by Dr. Pascal Lux.Risks and benefits discussed with the patient including bleeding, infection, damage to adjacent structures, bowel perforation/fistula connection, and sepsis.  All of the patient's questions were answered, patient is  agreeable to proceed. Consent signed and in chart.  Procedure scheduled for this afternoon   Thank you for this interesting consult.  I greatly enjoyed meeting Shyler Hamill and look forward to participating in their care.  A copy of this report was sent to the requesting provider on this date.  Electronically Signed: D. Rowe Robert, PA-C 10/20/2019, 2:20 PM

## 2019-10-20 NOTE — Progress Notes (Signed)
CC: abdominal pain  Subjective: Patient complains of ongoing abdominal pain, worse than yesterday.  She is tender over her entire abdominal wall.  She also complains of trouble breathing bc of the pain.  Objective: Vital signs in last 24 hours: Temp:  [98.7 F (37.1 C)-99.2 F (37.3 C)] 98.7 F (37.1 C) (09/10 0406) Pulse Rate:  [79-81] 79 (09/10 0406) Resp:  [16-19] 19 (09/10 0406) BP: (116-124)/(71-78) 116/71 (09/10 0406) SpO2:  [97 %-100 %] 100 % (09/10 0406) Last BM Date: 10/17/19 2117 IV No output recorded T-max 99.4 other vital signs are stable. CMP is normal except for glucose of 104, calcium 8.4,. CRP 32.9 WBC 16.5 H/H 9.4/31.2 Platelets 602,000 D-dimer >20 CT scan 10/17/2019: Multi focal free air throughout the abdomen consistent with perforated viscus.  Site of perforation is not definitely delineated however there is minimal wall thickening of the proximal Roux limb.  In the setting gastric bypass perforated marginal ulcer is considered another site of inflammation.  Small amount of free fluid in the pelvis.  There may be an organized collection posterior uterus measuring 4.9 cm.  Peripheral groundglass opacities and consolidative opacities in both lower lobes consistent with Covid pneumonia  Intake/Output from previous day: 09/09 0701 - 09/10 0700 In: 272.1 [I.V.:93.1; IV Piggyback:179] Out: -  Intake/Output this shift: No intake/output data recorded.  General appearance: alert, cooperative, mild distress and ongoing abdominal pain Resp: tachypnea, diminished breath sounds bilateral lung bases GI: she is tender all over her abdomen, I can't tell if it's worse than previous,  BS hypoactive  Lab Results:  Recent Labs    10/19/19 0404 10/20/19 0412  WBC 16.5* 15.2*  HGB 9.4* 8.7*  HCT 31.2* 27.8*  PLT 602* 769*    BMET Recent Labs    10/19/19 0404 10/20/19 0412  NA 140 141  K 3.9 3.2*  CL 107 105  CO2 22 24  GLUCOSE 104* 93  BUN 12 13   CREATININE 0.59 0.60  CALCIUM 8.4* 8.1*   PT/INR Recent Labs    10/18/19 0040  LABPROT 16.5*  INR 1.4*    Recent Labs  Lab 10/17/19 1723 10/19/19 0404 10/20/19 0412  AST 28 18 17   ALT 10 9 9   ALKPHOS 37* 40 42  BILITOT 0.7 1.0 0.8  PROT 9.2* 7.6 6.9  ALBUMIN 3.9 2.8* 2.5*     Lipase     Component Value Date/Time   LIPASE 20 10/17/2019 1723     Medications: . sodium chloride   Intravenous Once  . iohexol  500 mL Oral Q1H  . iohexol       . pantoprozole (PROTONIX) infusion 8 mg/hr (10/20/19 0542)  . piperacillin-tazobactam (ZOSYN)  IV 3.375 g (10/20/19 0542)  . remdesivir 100 mg in NS 100 mL Stopped (10/19/19 1845)   Assessment/Plan Symptomatic anemia Thrombocytosis Chest pain  SIRS  - WBC 13.6>>16.5>>15.2  - CRP 21.5>>32.9 >>31.2 Covid positive - Viral multifocal pneumonia Perforated viscus with history of gastric bypass  FEN: IV fluids/n.p.o. ID: Zosyn 9/8>> day 2; remdesivir 9/8 >>day 2 DVT: SCDs, hold chemical VTE for possible OR Follow-up: TBD  Plan:  Persistent abdominal pain and tachypnea. HR, RR are WNL. Elevated WBC, CRP, d dimer. Fluoro is not working currently so rather than UGI will proceed with CT abdomen with water soluble PO contrast this morning - if positive for leak patient will need to go to OR for ex lap.    LOS: 2 days    Jill Alexanders  10/20/2019 Please see Amion

## 2019-10-21 ENCOUNTER — Inpatient Hospital Stay (HOSPITAL_COMMUNITY): Payer: Medicaid - Out of State

## 2019-10-21 ENCOUNTER — Other Ambulatory Visit: Payer: Self-pay

## 2019-10-21 DIAGNOSIS — R6510 Systemic inflammatory response syndrome (SIRS) of non-infectious origin without acute organ dysfunction: Secondary | ICD-10-CM

## 2019-10-21 DIAGNOSIS — D62 Acute posthemorrhagic anemia: Secondary | ICD-10-CM

## 2019-10-21 LAB — CBC WITH DIFFERENTIAL/PLATELET
Abs Immature Granulocytes: 0.16 K/uL — ABNORMAL HIGH (ref 0.00–0.07)
Basophils Absolute: 0.1 K/uL (ref 0.0–0.1)
Basophils Relative: 0 %
Eosinophils Absolute: 0.3 K/uL (ref 0.0–0.5)
Eosinophils Relative: 2 %
HCT: 27.7 % — ABNORMAL LOW (ref 36.0–46.0)
Hemoglobin: 8.6 g/dL — ABNORMAL LOW (ref 12.0–15.0)
Immature Granulocytes: 1 %
Lymphocytes Relative: 9 %
Lymphs Abs: 1.3 K/uL (ref 0.7–4.0)
MCH: 23.7 pg — ABNORMAL LOW (ref 26.0–34.0)
MCHC: 31 g/dL (ref 30.0–36.0)
MCV: 76.3 fL — ABNORMAL LOW (ref 80.0–100.0)
Monocytes Absolute: 0.4 K/uL (ref 0.1–1.0)
Monocytes Relative: 3 %
Neutro Abs: 12.5 K/uL — ABNORMAL HIGH (ref 1.7–7.7)
Neutrophils Relative %: 85 %
Platelets: 773 K/uL — ABNORMAL HIGH (ref 150–400)
RBC: 3.63 MIL/uL — ABNORMAL LOW (ref 3.87–5.11)
RDW: 31.9 % — ABNORMAL HIGH (ref 11.5–15.5)
WBC: 14.7 K/uL — ABNORMAL HIGH (ref 4.0–10.5)
nRBC: 0.1 % (ref 0.0–0.2)

## 2019-10-21 LAB — COMPREHENSIVE METABOLIC PANEL WITH GFR
ALT: 9 U/L (ref 0–44)
AST: 16 U/L (ref 15–41)
Albumin: 2.5 g/dL — ABNORMAL LOW (ref 3.5–5.0)
Alkaline Phosphatase: 47 U/L (ref 38–126)
Anion gap: 15 (ref 5–15)
BUN: 11 mg/dL (ref 6–20)
CO2: 23 mmol/L (ref 22–32)
Calcium: 8.2 mg/dL — ABNORMAL LOW (ref 8.9–10.3)
Chloride: 102 mmol/L (ref 98–111)
Creatinine, Ser: 0.59 mg/dL (ref 0.44–1.00)
GFR calc Af Amer: >60 mL/min (ref >60)
GFR calc non Af Amer: >60 mL/min (ref >60)
Glucose, Bld: 85 mg/dL (ref 70–99)
Potassium: 3.5 mmol/L (ref 3.5–5.1)
Sodium: 140 mmol/L (ref 135–145)
Total Bilirubin: 0.8 mg/dL (ref 0.3–1.2)
Total Protein: 7 g/dL (ref 6.5–8.1)

## 2019-10-21 LAB — C-REACTIVE PROTEIN: CRP: 29.6 mg/dL — ABNORMAL HIGH (ref <1.0)

## 2019-10-21 LAB — MAGNESIUM: Magnesium: 2 mg/dL (ref 1.7–2.4)

## 2019-10-21 LAB — D-DIMER, QUANTITATIVE: D-Dimer, Quant: 12.28 ug/mL-FEU — ABNORMAL HIGH (ref 0.00–0.50)

## 2019-10-21 MED ORDER — CHLORHEXIDINE GLUCONATE CLOTH 2 % EX PADS
6.0000 | MEDICATED_PAD | Freq: Every day | CUTANEOUS | Status: DC
  Administered 2019-10-21 – 2019-10-29 (×9): 6 via TOPICAL

## 2019-10-21 MED ORDER — FENTANYL CITRATE (PF) 100 MCG/2ML IJ SOLN
INTRAMUSCULAR | Status: AC | PRN
  Administered 2019-10-21 (×2): 50 ug via INTRAVENOUS

## 2019-10-21 MED ORDER — SODIUM CHLORIDE 0.9 % IV SOLN
INTRAVENOUS | Status: DC | PRN
  Administered 2019-10-21: 250 mL via INTRAVENOUS

## 2019-10-21 MED ORDER — FENTANYL CITRATE (PF) 100 MCG/2ML IJ SOLN
INTRAMUSCULAR | Status: AC
Start: 2019-10-21 — End: 2019-10-22
  Filled 2019-10-21: qty 4

## 2019-10-21 MED ORDER — SODIUM CHLORIDE 0.9% FLUSH
10.0000 mL | INTRAVENOUS | Status: DC | PRN
  Administered 2019-10-25 – 2019-10-29 (×2): 10 mL

## 2019-10-21 MED ORDER — MIDAZOLAM HCL 2 MG/2ML IJ SOLN
INTRAMUSCULAR | Status: AC
  Filled 2019-10-21: qty 4

## 2019-10-21 MED ORDER — MIDAZOLAM HCL 2 MG/2ML IJ SOLN
INTRAMUSCULAR | Status: AC | PRN
  Administered 2019-10-21 (×2): 1 mg via INTRAVENOUS

## 2019-10-21 MED ORDER — SODIUM CHLORIDE 0.9% FLUSH
5.0000 mL | Freq: Three times a day (TID) | INTRAVENOUS | Status: DC
  Administered 2019-10-21 – 2019-10-29 (×23): 5 mL

## 2019-10-21 NOTE — Progress Notes (Signed)
Subjective/Chief Complaint: Pt with con't discomfort but better. No n/v Passing flatus and BMs Feels better   Objective: Vital signs in last 24 hours: Temp:  [98.3 F (36.8 C)-98.9 F (37.2 C)] 98.3 F (36.8 C) (09/11 0357) Pulse Rate:  [77-82] 78 (09/11 0357) Resp:  [15-18] 15 (09/11 0357) BP: (121-153)/(81-90) 124/81 (09/11 0357) SpO2:  [93 %-100 %] 93 % (09/11 0357) Last BM Date: 10/17/19  Intake/Output from previous day: 09/10 0701 - 09/11 0700 In: 129.7 [I.V.:10; IV Piggyback:119.7] Out: -  Intake/Output this shift: No intake/output data recorded.  PE:  Constitutional: No acute distress, conversant, appears states age. Eyes: Anicteric sclerae, moist conjunctiva, no lid lag Lungs: Clear to auscultation bilaterally, normal respiratory effort CV: regular rate and rhythm, no murmurs, no peripheral edema, pedal pulses 2+ GI: Soft, no masses or hepatosplenomegaly, non-tender to palpation, no rebound/guarding Skin: No rashes, palpation reveals normal turgor Psychiatric: appropriate judgment and insight, oriented to person, place, and time   Lab Results:  Recent Labs    10/19/19 0404 10/20/19 0412  WBC 16.5* 15.2*  HGB 9.4* 8.7*  HCT 31.2* 27.8*  PLT 602* 769*   BMET Recent Labs    10/20/19 0412 10/21/19 0411  NA 141 140  K 3.2* 3.5  CL 105 102  CO2 24 23  GLUCOSE 93 85  BUN 13 11  CREATININE 0.60 0.59  CALCIUM 8.1* 8.2*   PT/INR No results for input(s): LABPROT, INR in the last 72 hours. ABG No results for input(s): PHART, HCO3 in the last 72 hours.  Invalid input(s): PCO2, PO2  Studies/Results: CT ABDOMEN WO CONTRAST  Result Date: 10/20/2019 CLINICAL DATA:  History of gastric bypass 2009. Intraperitoneal free air on CT 10/17/2019. EXAM: CT ABDOMEN WITHOUT CONTRAST TECHNIQUE: Multidetector CT imaging of the abdomen was performed following the standard protocol without IV contrast. Water-soluble oral contrast administered COMPARISON:  CT  10/17/2019 FINDINGS: Lower chest: Dense bibasilar consolidation and small effusions. Increased atelectasis and effusions compared to prior. Hepatobiliary: No focal lesion on noncontrast exam. Gallbladder normal. Pancreas: Normal pancreatic parenchymal intensity. No ductal dilatation or inflammation. Spleen: Normal spleen. Adrenals/urinary tract: Adrenal glands and kidneys are normal. Stomach/Bowel: Post gastric bypass anatomy. Water-soluble contrast is evident in the gastric pouch and proximal jejunum. No evidence of leak at the gastrojejunostomy. No evidence of leak elsewhere of the oral contrast. Some contrast does make it to the more distal mid small bowel in the upper pelvis. Again demonstrated moderate volume intraperitoneal free air. The volume free air is increased over the beneath the RIGHT hemidiaphragm (image 24/series 4/coronal). There is a fluid collection in the LEFT upper quadrant with an air-fluid level measuring 7.9 x 5.6 cm (image 13/2). This is more defined collection than on comparison exam. Vascular/Lymphatic: Abdominal aortic normal caliber. No retroperitoneal periportal lymphadenopathy. Musculoskeletal: No aggressive osseous lesion IMPRESSION: 1. Increased organization of fluid collection beneath the LEFT hemidiaphragm is concerning for abscess formation. 2. No evidence active leaking of the water-soluble oral contrast at the gastrojejunostomy or elsewhere proximal GI tract. 3. Moderate volume intraperitoneal free air. Increased free air beneath the RIGHT hemidiaphragm. 4. Dense bibasilar consolidation with increasing atelectasis and effusion. These results will be called to the ordering clinician or representative by the Radiologist Assistant, and communication documented in the PACS or [employer]. Electronically Signed   By: Suzy Bouchard M.D.   On: 10/20/2019 12:15   [place] EKG SITE RITE  Result Date: 10/20/2019 If Site Rite image not attached, placement could not be confirmed  due  to current cardiac rhythm.   Anti-infectives: Anti-infectives (From admission, onward)   Start     Dose/Rate Route Frequency Ordered Stop   10/19/19 1000  remdesivir 100 mg in sodium chloride 0.9 % 100 mL IVPB       "Followed by" Linked Group Details   100 mg 200 mL/hr over 30 Minutes Intravenous Daily 10/18/19 0515 10/23/19 0959   10/18/19 0600  remdesivir 200 mg in sodium chloride 0.9% 250 mL IVPB       "Followed by" Linked Group Details   200 mg 580 mL/hr over 30 Minutes Intravenous Once 10/18/19 0515 10/18/19 0633   10/18/19 0600  piperacillin-tazobactam (ZOSYN) IVPB 3.375 g        3.375 g 12.5 mL/hr over 240 Minutes Intravenous Every 8 hours 10/18/19 0520     10/17/19 2330  piperacillin-tazobactam (ZOSYN) IVPB 3.375 g        3.375 g 100 mL/hr over 30 Minutes Intravenous  Once 10/17/19 2329 10/18/19 0125      Assessment/Plan: Symptomatic anemia Thrombocytosis Chest pain  SIRS  - WBC 13.6>>16.5>>15.2  - CRP 21.5>>32.9 >>31.2 Covid positive - Viral multifocal pneumonia Perforated viscus with history of gastric bypass  FEN: IV fluids/n.p.o. ID: Zosyn 9/8>> day 2; remdesivir 9/8 >>day 2 DVT: SCDs, hold chemical VTE for possible OR Follow-up: TBD  Plan:  -Abd pain better.  Plan for IR drainage of IAA. -plan for clears after IR procedure. -con't abx  LOS: 3 days    Ralene Ok 10/21/2019

## 2019-10-21 NOTE — Progress Notes (Signed)
Peripherally Inserted Central Catheter Placement  The IV Nurse has discussed with the patient and/or persons authorized to consent for the patient, the purpose of this procedure and the potential benefits and risks involved with this procedure.  The benefits include less needle sticks, lab draws from the catheter, and the patient may be discharged home with the catheter. Risks include, but not limited to, infection, bleeding, blood clot (thrombus formation), and puncture of an artery; nerve damage and irregular heartbeat and possibility to perform a PICC exchange if needed/ordered by physician.  Alternatives to this procedure were also discussed.  Bard Power PICC patient education guide, fact sheet on infection prevention and patient information card has been provided to patient /or left at bedside.    PICC Placement Documentation  PICC Double Lumen 10/21/19 PICC Right Brachial 37 cm 1 cm (Active)  Indication for Insertion or Continuance of Line Poor Vasculature-patient has had multiple peripheral attempts or PIVs lasting less than 24 hours 10/21/19 1158  Exposed Catheter (cm) 1 cm 10/21/19 1158  Site Assessment Clean;Dry;Intact 10/21/19 1158  Lumen #1 Status Flushed;Saline locked;Blood return noted 10/21/19 1158  Lumen #2 Status Flushed;Saline locked;Blood return noted 10/21/19 1158  Dressing Type Transparent 10/21/19 1158  Dressing Status Clean;Dry;Intact;Antimicrobial disc in place 10/21/19 1158  Safety Lock Not Applicable 67/34/19 3790  Line Care Connections checked and tightened 10/21/19 1158  Line Adjustment (NICU/IV Team Only) No 10/21/19 1158  Dressing Intervention New dressing 10/21/19 1158  Dressing Change Due 10/28/19 10/21/19 1158       Rolena Infante 10/21/2019, 11:59 AM

## 2019-10-21 NOTE — Procedures (Signed)
Interventional Radiology Procedure Note  Procedure: Placement of a 72F drain into the LUQ fluid and gas collection.  Aspiration yields over 120 mL turbid bilious fluid resembling gastric secretions.   Complications: None  Estimated Blood Loss: None  Recommendations: - Drain to JP bulb - Fluid sent for culture   Signed,  Criselda Peaches, MD

## 2019-10-21 NOTE — Progress Notes (Signed)
PROGRESS NOTE    Kendra [name]  RKY:[id number] DOB: May 31, 1975 DOA: 10/17/2019 PCP: System, Pcp Not In    Brief Narrative:  44 year old lady with history of gastric bypass in 2009, no chronic medical issues, unvaccinated against COVID-19 presented to the ER on 9/7 with abdominal pain and Covid-like symptoms for about 2 weeks, with abdominal pain worsening so she came in.  Noted to be COVID-19 positive and had melanotic stool with hemoglobin of 8.2.  CT scan of abdomen noted multiple areas of free air and organized collection posterior to the uterus consistent with perforated viscus.  Patient started on IV PPI plus antibiotic and surgery consulted.  Admitted to the hospitalist service.  In regards to Covid, patient does not appear to be very symptomatic.  On Remdisivir.   Seen in recovery post procedure, no complaints.  Somewhat somnolent   Assessment & Plan:   Principal Problem:   Perforated viscus Active Problems:   History of gastric bypass   SIRS (systemic inflammatory response syndrome) (HCC)   Pneumonia due to COVID-19 virus   Anemia  Suspected perforated bleeding anastomotic ulcer: Seen by general surgery and no plans for immediate intervention.  Interventional radiology consulted had placement of drain in left upper quadrant on 9/11.  Procedure without complication.  Started on clear liquids after.  Pneumonia due to COVID-19: Patient continues to do well.  Has completed 4 days of Remdisivir.  Elevated CRP persistent, but that may be more in part due to her perforation.  Otherwise stable from respiratory standpoint and not requiring oxygen.  No steroids needed.   COVID-19 Labs  Recent Labs    10/19/19 0404 10/20/19 0412 10/21/19 0411  DDIMER >20.00* 17.13* 12.28*  CRP 32.9* 31.2* 29.6*    Acute blood loss anemia Baseline hemoglobin unknown.  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   Hemoglobin was noted to be 6.2 on 9/8.  She was transfused 2 units of  blood.  Improved.  Continue to monitor.     DVT prophylaxis: SCDs Code Status: Full code Family Communication: Discussed with the patient Disposition Plan: Return home when improved, cleared by surgery  Status is: Inpatient  Remains inpatient appropriate because:IV treatments appropriate due to intensity of illness or inability to take PO and Inpatient level of care appropriate due to severity of illness   Dispo:  Patient From: Home  Planned Disposition: Home  Expected discharge date: 10/23/19  Medically stable for discharge: No        Consultants:   Surgery  Interventional radiology  Procedures:   Blood transfusion 9/8: 2 units packed red blood cells  Placement of drain by interventional radiology 9/11  Antimicrobials:  Anti-infectives (From admission, onward)   Start     Dose/Rate Route Frequency Ordered Stop   10/19/19 1000  remdesivir 100 mg in sodium chloride 0.9 % 100 mL IVPB       "Followed by" Linked Group Details   100 mg 200 mL/hr over 30 Minutes Intravenous Daily 10/18/19 0515 10/23/19 0959   10/18/19 0600  remdesivir 200 mg in sodium chloride 0.9% 250 mL IVPB       "Followed by" Linked Group Details   200 mg 580 mL/hr over 30 Minutes Intravenous Once 10/18/19 0515 10/18/19 0633   10/18/19 0600  piperacillin-tazobactam (ZOSYN) IVPB 3.375 g        3.375 g 12.5 mL/hr over 240 Minutes Intravenous Every 8 hours 10/18/19 0520     10/17/19 2330  piperacillin-tazobactam (ZOSYN) IVPB 3.375 g  3.375 g 100 mL/hr over 30 Minutes Intravenous  Once 10/17/19 2329 10/18/19 0125          Objective: Vitals:   10/21/19 1441 10/21/19 1450 10/21/19 1505 10/21/19 1522  BP: 126/75 119/72 121/78 129/88  Pulse: 80 77 77 76  Resp: 20 (!) 22 (!) 21 17  Temp:    99.5 F (37.5 C)  TempSrc:    Oral  SpO2: 98% 97% 94% 98%  Weight:    63.3 kg  Height:    5\' 5"  (1.651 m)    Intake/Output Summary (Last 24 hours) at 10/21/2019 1754 Last data filed at 10/21/2019  1700 Gross per 24 hour  Intake 0 ml  Output 160 ml  Net -160 ml   Filed Weights   10/21/19 1522  Weight: 63.3 kg    Examination:  General appearance: Slightly drowsy Resp: Clear to auscultation bilaterally Cardio: Regular rate and rhythm, S1-S2 GI: Soft, noted drain in place, hypoactive bowel sounds Neurologic:  No focal neurological deficits.    Data Reviewed: I have personally reviewed following labs and imaging studies  CBC: Recent Labs  Lab 10/18/19 0540 10/18/19 0540 10/18/19 0739 10/18/19 2051 10/19/19 0404 10/20/19 0412 10/21/19 0411  WBC QUESTIONABLE RESULTS, RECOMMEND RECOLLECT TO VERIFY  --  13.6*  --  16.5* 15.2* 14.7*  NEUTROABS  --   --   --   --  15.0* 13.6* 12.5*  HGB QUESTIONABLE RESULTS, RECOMMEND RECOLLECT TO VERIFY   < > 6.2* 9.2* 9.4* 8.7* 8.6*  HCT QUESTIONABLE RESULTS, RECOMMEND RECOLLECT TO VERIFY   < > 21.8* 31.3* 31.2* 27.8* 27.7*  MCV QUESTIONABLE RESULTS, RECOMMEND RECOLLECT TO VERIFY  --  67.1*  --  74.5* 74.3* 76.3*  PLT QUESTIONABLE RESULTS, RECOMMEND RECOLLECT TO VERIFY  --  721*  --  602* 769* 773*   < > = values in this interval not displayed.   Basic Metabolic Panel: Recent Labs  Lab 10/17/19 1723 10/19/19 0404 10/20/19 0412 10/21/19 0411  NA 136 140 141 140  K 4.4 3.9 3.2* 3.5  CL 99 107 105 102  CO2 18* 22 24 23   GLUCOSE 118* 104* 93 85  BUN 16 12 13 11   CREATININE 0.69 0.59 0.60 0.59  CALCIUM 9.1 8.4* 8.1* 8.2*  MG  --   --   --  2.0   GFR: Estimated Creatinine Clearance: 80.8 mL/min (by C-G formula based on SCr of 0.59 mg/dL). Liver Function Tests: Recent Labs  Lab 10/17/19 1723 10/19/19 0404 10/20/19 0412 10/21/19 0411  AST 28 18 17 16   ALT 10 9 9 9   ALKPHOS 37* 40 42 47  BILITOT 0.7 1.0 0.8 0.8  PROT 9.2* 7.6 6.9 7.0  ALBUMIN 3.9 2.8* 2.5* 2.5*   Recent Labs  Lab 10/17/19 1723  LIPASE 20   Coagulation Profile: Recent Labs  Lab 10/18/19 0040  INR 1.4*   Anemia Panel: No results for input(s):  VITAMINB12, FOLATE, FERRITIN, TIBC, IRON, RETICCTPCT in the last 72 hours. Sepsis Labs: Recent Labs  Lab 10/18/19 0540 10/18/19 0739  PROCALCITON QUESTIONABLE RESULTS, RECOMMEND RECOLLECT TO VERIFY 2.13  LATICACIDVEN 1.1 0.9    Recent Results (from the past 240 hour(s))  SARS Coronavirus 2 by RT PCR (hospital order, performed in [facility] hospital lab) Nasopharyngeal Nasopharyngeal Swab     Status: Abnormal   Collection Time: 10/17/19  5:26 PM   Specimen: Nasopharyngeal Swab  Result Value Ref Range Status   SARS Coronavirus 2 POSITIVE (A) NEGATIVE Final  Comment: RESULT CALLED TO, READ BACK BY AND VERIFIED WITH: I.HODGES AT 1950 ON 10/17/19 BY N.THOMPSON (NOTE) SARS-CoV-2 target nucleic acids are DETECTED  SARS-CoV-2 RNA is generally detectable in upper respiratory specimens  during the acute phase of infection.  Positive results are indicative  of the presence of the identified virus, but do not rule out bacterial infection or co-infection with other pathogens not detected by the test.  Clinical correlation with patient history and  other diagnostic information is necessary to determine patient infection status.  The expected result is negative.  Fact Sheet for Patients:   StrictlyIdeas.no   Fact Sheet for Healthcare Providers:   BankingDealers.co.za    This test is not yet approved or cleared by the [place] FDA and  has been authorized for detection and/or diagnosis of SARS-CoV-2 by FDA under an Emergency Use Authorization (EUA).  This EUA will remain in effect (meaning t his test can be used) for the duration of  the COVID-19 declaration under Section 564(b)(1) of the Act, 21 U.S.C. section 360-bbb-3(b)(1), unless the authorization is terminated or revoked sooner.  Performed at [facility], [place] [address], [place], [place] [address]   Culture, blood (Routine X 2) w Reflex to ID Panel     Status:  None (Preliminary result)   Collection Time: 10/18/19  5:30 AM   Specimen: BLOOD  Result Value Ref Range Status   Specimen Description   Final    BLOOD RIGHT ANTECUBITAL Performed at [facility] [address] [place], [place] [address]    Special Requests   Final    BOTTLES DRAWN AEROBIC AND ANAEROBIC Blood Culture adequate volume Performed at [facility] [address], [place], [place] [address]    Culture   Final    NO GROWTH 3 DAYS Performed at [facility], [place] [address] [place], [place] [address]    Report Status PENDING  Incomplete  Culture, blood (Routine X 2) w Reflex to ID Panel     Status: None (Preliminary result)   Collection Time: 10/18/19  5:40 AM   Specimen: BLOOD RIGHT HAND  Result Value Ref Range Status   Specimen Description   Final    BLOOD RIGHT HAND Performed at [facility] [address] [place], [place] [address]    Special Requests   Final    BOTTLES DRAWN AEROBIC AND ANAEROBIC Blood Culture adequate volume Performed at [facility] [address] [place], [place] [address]    Culture   Final    NO GROWTH 3 DAYS Performed at [facility], [place] [address] [place], [place] [address]    Report Status PENDING  Incomplete         Radiology Studies:      Scheduled Meds: . sodium chloride   Intravenous Once  . Chlorhexidine Gluconate Cloth  6 each Topical Daily  . fentaNYL      . midazolam      . pantoprazole (PROTONIX) IV  40 mg Intravenous Q12H  . sodium chloride flush  5 mL Intracatheter Q8H   Continuous Infusions: . piperacillin-tazobactam (ZOSYN)  IV 3.375 g (10/21/19 1537)  . remdesivir 100 mg in NS 100 mL 100 mg (10/21/19 1247)     LOS: 3 days        Annita Brod, MD [facility]

## 2019-10-21 NOTE — Progress Notes (Signed)
Kasia RN notified of plan to place PICC today.

## 2019-10-22 DIAGNOSIS — R739 Hyperglycemia, unspecified: Secondary | ICD-10-CM | POA: Diagnosis not present

## 2019-10-22 DIAGNOSIS — N179 Acute kidney failure, unspecified: Secondary | ICD-10-CM | POA: Diagnosis not present

## 2019-10-22 LAB — CBC WITH DIFFERENTIAL/PLATELET
Abs Immature Granulocytes: 0.18 10*3/uL — ABNORMAL HIGH (ref 0.00–0.07)
Basophils Absolute: 0 10*3/uL (ref 0.0–0.1)
Basophils Relative: 0 %
Eosinophils Absolute: 0 10*3/uL (ref 0.0–0.5)
Eosinophils Relative: 0 %
HCT: 38.1 % (ref 36.0–46.0)
Hemoglobin: 12.3 g/dL (ref 12.0–15.0)
Immature Granulocytes: 1 %
Lymphocytes Relative: 3 %
Lymphs Abs: 0.6 10*3/uL — ABNORMAL LOW (ref 0.7–4.0)
MCH: 24.2 pg — ABNORMAL LOW (ref 26.0–34.0)
MCHC: 32.3 g/dL (ref 30.0–36.0)
MCV: 74.9 fL — ABNORMAL LOW (ref 80.0–100.0)
Monocytes Absolute: 0.4 10*3/uL (ref 0.1–1.0)
Monocytes Relative: 2 %
Neutro Abs: 16.3 10*3/uL — ABNORMAL HIGH (ref 1.7–7.7)
Neutrophils Relative %: 94 %
Platelets: 427 10*3/uL — ABNORMAL HIGH (ref 150–400)
RBC: 5.09 MIL/uL (ref 3.87–5.11)
RDW: 18.7 % — ABNORMAL HIGH (ref 11.5–15.5)
WBC: 17.5 10*3/uL — ABNORMAL HIGH (ref 4.0–10.5)
nRBC: 0 % (ref 0.0–0.2)

## 2019-10-22 LAB — COMPREHENSIVE METABOLIC PANEL WITH GFR
ALT: 27 U/L (ref 0–44)
AST: 24 U/L (ref 15–41)
Albumin: 2.5 g/dL — ABNORMAL LOW (ref 3.5–5.0)
Alkaline Phosphatase: 105 U/L (ref 38–126)
Anion gap: 10 (ref 5–15)
BUN: 36 mg/dL — ABNORMAL HIGH (ref 6–20)
CO2: 27 mmol/L (ref 22–32)
Calcium: 8.6 mg/dL — ABNORMAL LOW (ref 8.9–10.3)
Chloride: 106 mmol/L (ref 98–111)
Creatinine, Ser: 1.29 mg/dL — ABNORMAL HIGH (ref 0.44–1.00)
GFR calc Af Amer: 58 mL/min — ABNORMAL LOW
GFR calc non Af Amer: 50 mL/min — ABNORMAL LOW
Glucose, Bld: 220 mg/dL — ABNORMAL HIGH (ref 70–99)
Potassium: 5.3 mmol/L — ABNORMAL HIGH (ref 3.5–5.1)
Sodium: 143 mmol/L (ref 135–145)
Total Bilirubin: 0.7 mg/dL (ref 0.3–1.2)
Total Protein: 7.1 g/dL (ref 6.5–8.1)

## 2019-10-22 LAB — HEMOGLOBIN A1C
Hgb A1c MFr Bld: 8.9 % — ABNORMAL HIGH (ref 4.8–5.6)
Mean Plasma Glucose: 208.73 mg/dL

## 2019-10-22 LAB — GLUCOSE, CAPILLARY
Glucose-Capillary: 101 mg/dL — ABNORMAL HIGH (ref 70–99)
Glucose-Capillary: 91 mg/dL (ref 70–99)

## 2019-10-22 LAB — C-REACTIVE PROTEIN: CRP: 2.5 mg/dL — ABNORMAL HIGH

## 2019-10-22 LAB — D-DIMER, QUANTITATIVE: D-Dimer, Quant: 2.53 ug{FEU}/mL — ABNORMAL HIGH (ref 0.00–0.50)

## 2019-10-22 MED ORDER — ENOXAPARIN SODIUM 40 MG/0.4ML ~~LOC~~ SOLN
40.0000 mg | SUBCUTANEOUS | Status: DC
  Administered 2019-10-22: 40 mg via SUBCUTANEOUS
  Filled 2019-10-22: qty 0.4

## 2019-10-22 MED ORDER — HEPARIN SODIUM (PORCINE) 5000 UNIT/ML IJ SOLN
5000.0000 [IU] | Freq: Three times a day (TID) | INTRAMUSCULAR | Status: DC
  Administered 2019-10-23 – 2019-10-28 (×17): 5000 [IU] via SUBCUTANEOUS
  Filled 2019-10-22 (×17): qty 1

## 2019-10-22 MED ORDER — SODIUM CHLORIDE 0.9 % IV SOLN: INTRAVENOUS | Status: DC | PRN

## 2019-10-22 MED ORDER — INSULIN ASPART 100 UNIT/ML ~~LOC~~ SOLN: 0.0000 [IU] | Freq: Three times a day (TID) | SUBCUTANEOUS | Status: DC

## 2019-10-22 MED ORDER — HYDROMORPHONE HCL 1 MG/ML IJ SOLN
0.5000 mg | INTRAMUSCULAR | Status: DC | PRN
  Administered 2019-10-22 – 2019-10-23 (×3): 0.5 mg via INTRAVENOUS
  Filled 2019-10-22 (×4): qty 0.5

## 2019-10-22 NOTE — Progress Notes (Signed)
PROGRESS NOTE    Kendra [name]  ZDG:[id number] DOB: August 19, 1975 DOA: 10/17/2019 PCP: System, Pcp Not In    Brief Narrative:  44 year old lady with history of gastric bypass in 2009, no chronic medical issues, unvaccinated against COVID-19 presented to the ER on 9/7 with abdominal pain and Covid-like symptoms for about 2 weeks, with abdominal pain worsening so she came in.  Noted to be COVID-19 positive and had melanotic stool with hemoglobin of 8.2.  CT scan of abdomen noted multiple areas of free air and organized collection posterior to the uterus consistent with perforated viscus.  Patient started on IV PPI plus antibiotic and surgery consulted.  Admitted to the hospitalist service.  In regards to Covid, patient does not appear to be very symptomatic.  On room air.  On Remdisivir.  General surgery consulted interventional urology and patient had drain placed 9/11.  Started on clear liquids after and has been tolerating.  Advance to full liquids today.  Patient having some mild pain, controlled with pain medication.  This morning, noted to be in acute kidney injury I & D CBGs have been elevated-no history of diabetes.  Assessment & Plan:   Principal Problem:   Perforated viscus Active Problems:   History of gastric bypass   SIRS (systemic inflammatory response syndrome) (HCC)   Pneumonia due to COVID-19 virus   Anemia   AKI (acute kidney injury) ([place])   Hyperglycemia  Suspected perforated bleeding anastomotic ulcer: Seen by general surgery and no plans for immediate intervention.  Interventional radiology consulted, had placement of drain in left upper quadrant on 9/11.  Procedure without complication.  Diet being advanced.  Tolerating.  Pneumonia due to COVID-19: Patient continues to do well.  Has completed 5 days of Remdisivir.  Elevated CRP persistent, but that may be more in part due to her perforation.  Otherwise stable from respiratory standpoint and not requiring oxygen.  No  steroids needed.   COVID-19 Labs  Recent Labs    10/20/19 0412 10/21/19 0411 10/22/19 0500  DDIMER 17.13* 12.28* 2.53*  CRP 31.2* 29.6* 2.5*    Acute blood loss anemia Baseline hemoglobin unknown.  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   Hemoglobin was noted to be 6.2 on 9/8.  She was transfused 2 units of blood.  Improved.  Continue to monitor.    Acute kidney injury: May be secondary to fluid loss and lack of p.o.  Started on gentle IV fluids.  Hyperglycemia: Now that she started taking p.o., CBGs jumped to 220.  No previous history of diabetes.  Checking A1c.  Put on gentle sliding scale.   DVT prophylaxis: SCDs Code Status: Full code Family Communication: Left message for family Disposition Plan: Return home when improved, cleared by surgery  Status is: Inpatient  Remains inpatient appropriate because:IV treatments appropriate due to intensity of illness or inability to take PO and Inpatient level of care appropriate due to severity of illness   Dispo:  Patient From: Home  Planned Disposition: Home  Expected discharge date: 09/14  Medically stable for discharge: No        Consultants:   Surgery  Interventional radiology  Procedures:   Blood transfusion 9/8: 2 units packed red blood cells  Placement of drain by interventional radiology 9/11  Antimicrobials:  Anti-infectives (From admission, onward)   Start     Dose/Rate Route Frequency Ordered Stop   10/19/19 1000  remdesivir 100 mg in sodium chloride 0.9 % 100 mL IVPB       "  Followed by" Linked Group Details   100 mg 200 mL/hr over 30 Minutes Intravenous Daily 10/18/19 0515 10/22/19 1042   10/18/19 0600  remdesivir 200 mg in sodium chloride 0.9% 250 mL IVPB       "Followed by" Linked Group Details   200 mg 580 mL/hr over 30 Minutes Intravenous Once 10/18/19 0515 10/18/19 0633   10/18/19 0600  piperacillin-tazobactam (ZOSYN) IVPB 3.375 g        3.375 g 12.5 mL/hr over 240 Minutes  Intravenous Every 8 hours 10/18/19 0520     10/17/19 2330  piperacillin-tazobactam (ZOSYN) IVPB 3.375 g        3.375 g 100 mL/hr over 30 Minutes Intravenous  Once 10/17/19 2329 10/18/19 0125          Objective: Vitals:   10/21/19 1522 10/21/19 2045 10/22/19 0501 10/22/19 1335  BP: 129/88 132/86 134/90 109/71  Pulse: 76 82 68 (!) 104  Resp: 17 18 19 20   Temp: 99.5 F (37.5 C) 99.4 F (37.4 C) 98.8 F (37.1 C) 99.8 F (37.7 C)  TempSrc: Oral Oral Oral Oral  SpO2: 98% 100% 98% 100%  Weight: 63.3 kg     Height: 5\' 5"  (1.651 m)       Intake/Output Summary (Last 24 hours) at 10/22/2019 1402 Last data filed at 10/22/2019 0501 Gross per 24 hour  Intake 240 ml  Output 230 ml  Net 10 ml   Filed Weights   10/21/19 1522  Weight: 63.3 kg    Examination:  General appearance: Alert and oriented x3, no acute distress HEENT: Normocephalic, atraumatic, mucous membranes slightly dry Resp: Clear to auscultation bilaterally Cardio: Regular rate and rhythm, S1-S2 GI: Soft, noted drain in place, hypoactive bowel sounds Extremities: No clubbing or cyanosis or edema Neurologic:  No focal neurological deficits.  Psychiatry: Patient is appropriate, no evidence of psychoses   Data Reviewed: I have personally reviewed following labs and imaging studies  CBC: Recent Labs  Lab 10/18/19 0739 10/18/19 0739 10/18/19 2051 10/19/19 0404 10/20/19 0412 10/21/19 0411 10/22/19 0500  WBC 13.6*  --   --  16.5* 15.2* 14.7* 17.5*  NEUTROABS  --   --   --  15.0* 13.6* 12.5* 16.3*  HGB 6.2*   < > 9.2* 9.4* 8.7* 8.6* 12.3  HCT 21.8*   < > 31.3* 31.2* 27.8* 27.7* 38.1  MCV 67.1*  --   --  74.5* 74.3* 76.3* 74.9*  PLT 721*  --   --  602* 769* 773* 427*   < > = values in this interval not displayed.   Basic Metabolic Panel: Recent Labs  Lab 10/17/19 1723 10/19/19 0404 10/20/19 0412 10/21/19 0411 10/22/19 0500  NA 136 140 141 140 143  K 4.4 3.9 3.2* 3.5 5.3*  CL 99 107 105 102 106  CO2  18* 22 24 23 27   GLUCOSE 118* 104* 93 85 220*  BUN 16 12 13 11  36*  CREATININE 0.69 0.59 0.60 0.59 1.29*  CALCIUM 9.1 8.4* 8.1* 8.2* 8.6*  MG  --   --   --  2.0  --    GFR: Estimated Creatinine Clearance: 50.1 mL/min (A) (by C-G formula based on SCr of 1.29 mg/dL (H)). Liver Function Tests: Recent Labs  Lab 10/17/19 1723 10/19/19 0404 10/20/19 0412 10/21/19 0411 10/22/19 0500  AST 28 18 17 16 24   ALT 10 9 9 9 27   ALKPHOS 37* 40 42 47 105  BILITOT 0.7 1.0 0.8 0.8 0.7  PROT 9.2* 7.6  6.9 7.0 7.1  ALBUMIN 3.9 2.8* 2.5* 2.5* 2.5*   Recent Labs  Lab 10/17/19 1723  LIPASE 20   Coagulation Profile: Recent Labs  Lab 10/18/19 0040  INR 1.4*   Anemia Panel: No results for input(s): VITAMINB12, FOLATE, FERRITIN, TIBC, IRON, RETICCTPCT in the last 72 hours. Sepsis Labs: Recent Labs  Lab 10/18/19 0540 10/18/19 0739  PROCALCITON QUESTIONABLE RESULTS, RECOMMEND RECOLLECT TO VERIFY 2.13  LATICACIDVEN 1.1 0.9    Recent Results (from the past 240 hour(s))  SARS Coronavirus 2 by RT PCR (hospital order, performed in [facility] hospital lab) Nasopharyngeal Nasopharyngeal Swab     Status: Abnormal   Collection Time: 10/17/19  5:26 PM   Specimen: Nasopharyngeal Swab  Result Value Ref Range Status   SARS Coronavirus 2 POSITIVE (A) NEGATIVE Final    Comment: RESULT CALLED TO, READ BACK BY AND VERIFIED WITH: I.HODGES AT 1950 ON 10/17/19 BY N.THOMPSON (NOTE) SARS-CoV-2 target nucleic acids are DETECTED  SARS-CoV-2 RNA is generally detectable in upper respiratory specimens  during the acute phase of infection.  Positive results are indicative  of the presence of the identified virus, but do not rule out bacterial infection or co-infection with other pathogens not detected by the test.  Clinical correlation with patient history and  other diagnostic information is necessary to determine patient infection status.  The expected result is negative.  Fact Sheet for Patients:     StrictlyIdeas.no   Fact Sheet for Healthcare Providers:   BankingDealers.co.za    This test is not yet approved or cleared by the [place] FDA and  has been authorized for detection and/or diagnosis of SARS-CoV-2 by FDA under an Emergency Use Authorization (EUA).  This EUA will remain in effect (meaning t his test can be used) for the duration of  the COVID-19 declaration under Section 564(b)(1) of the Act, 21 U.S.C. section 360-bbb-3(b)(1), unless the authorization is terminated or revoked sooner.  Performed at [facility], [place] [address] [place], [place] [address]   Culture, blood (Routine X 2) w Reflex to ID Panel     Status: None (Preliminary result)   Collection Time: 10/18/19  5:30 AM   Specimen: BLOOD  Result Value Ref Range Status   Specimen Description   Final    BLOOD RIGHT ANTECUBITAL Performed at [facility] [address] [place], [place] [address]    Special Requests   Final    BOTTLES DRAWN AEROBIC AND ANAEROBIC Blood Culture adequate volume Performed at [facility] [address] [place], [place] [address]    Culture   Final    NO GROWTH 3 DAYS Performed at [facility], [place] [address] [place], [place] [address]    Report Status PENDING  Incomplete  Culture, blood (Routine X 2) w Reflex to ID Panel     Status: None (Preliminary result)   Collection Time: 10/18/19  5:40 AM   Specimen: BLOOD RIGHT HAND  Result Value Ref Range Status   Specimen Description   Final    BLOOD RIGHT HAND Performed at [facility] [address], [place], [place] [address]    Special Requests   Final    BOTTLES DRAWN AEROBIC AND ANAEROBIC Blood Culture adequate volume Performed at [facility] [address] [place], [place] [address]    Culture   Final    NO GROWTH 3 DAYS Performed at [facility], [place] [address]  [place], [place] [address]    Report Status PENDING  Incomplete  Aerobic/Anaerobic Culture (surgical/deep wound)     Status: None (Preliminary result)   Collection Time: 10/21/19  3:11 PM   Specimen: Abscess  Result Value Ref Range Status   Specimen Description   Final    ABSCESS Performed at [facility] [address], [place], [place] [address]    Special Requests   Final    Normal Performed at [facility], [place] [address], [place], [place] [address]    Gram Stain   Final    RARE WBC PRESENT, PREDOMINANTLY PMN NO ORGANISMS SEEN    Culture   Final    NO GROWTH < 24 HOURS Performed at [facility] [address], [place],  [address]    Report Status PENDING  Incomplete         Radiology Studies:      Scheduled Meds: . Chlorhexidine Gluconate Cloth  6 each Topical Daily  . [START ON 10/23/2019] heparin injection (subcutaneous)  5,000 Units Subcutaneous Q8H  . insulin aspart  0-9 Units Subcutaneous TID WC  . pantoprazole (PROTONIX) IV  40 mg Intravenous Q12H  . sodium chloride flush  5 mL Intracatheter Q8H   Continuous Infusions: . sodium chloride    . piperacillin-tazobactam (ZOSYN)  IV 3.375 g (10/22/19 1013)     LOS: 4 days        Annita Brod, MD [facility]

## 2019-10-22 NOTE — Progress Notes (Signed)
Subjective/Chief Complaint: Pt with IR drain placed Pt with better abd pain    Objective: Vital signs in last 24 hours: Temp:  [98.8 F (37.1 C)-99.5 F (37.5 C)] 98.8 F (37.1 C) (09/12 0501) Pulse Rate:  [68-82] 68 (09/12 0501) Resp:  [17-22] 19 (09/12 0501) BP: (119-134)/(72-90) 134/90 (09/12 0501) SpO2:  [94 %-100 %] 98 % (09/12 0501) Weight:  [63.3 kg] 63.3 kg (09/11 1522) Last BM Date: 10/17/19  Intake/Output from previous day: 09/11 0701 - 09/12 0700 In: 240 [P.O.:240] Out: 230 [Drains:230] Intake/Output this shift: No intake/output data recorded.   PE:  Constitutional: No acute distress, conversant, appears states age. Eyes: Anicteric sclerae, moist conjunctiva, no lid lag Lungs: Clear to auscultation bilaterally, normal respiratory effort CV: regular rate and rhythm, no murmurs, no peripheral edema, pedal pulses 2+ GI: Soft, no masses or hepatosplenomegaly, min-tender to palpation, LUQ drain in place and serous Skin: No rashes, palpation reveals normal turgor Psychiatric: appropriate judgment and insight, oriented to person, place, and time   Lab Results:  Recent Labs    10/21/19 0411 10/22/19 0500  WBC 14.7* 17.5*  HGB 8.6* 12.3  HCT 27.7* 38.1  PLT 773* 427*   BMET Recent Labs    10/21/19 0411 10/22/19 0500  NA 140 143  K 3.5 5.3*  CL 102 106  CO2 23 27  GLUCOSE 85 220*  BUN 11 36*  CREATININE 0.59 1.29*  CALCIUM 8.2* 8.6*   PT/INR No results for input(s): LABPROT, INR in the last 72 hours. ABG No results for input(s): PHART, HCO3 in the last 72 hours.  Invalid input(s): PCO2, PO2  Studies/Results: CT ABDOMEN WO CONTRAST  Result Date: 10/20/2019 CLINICAL DATA:  History of gastric bypass 2009. Intraperitoneal free air on CT 10/17/2019. EXAM: CT ABDOMEN WITHOUT CONTRAST TECHNIQUE: Multidetector CT imaging of the abdomen was performed following the standard protocol without IV contrast. Water-soluble oral contrast administered  COMPARISON:  CT 10/17/2019 FINDINGS: Lower chest: Dense bibasilar consolidation and small effusions. Increased atelectasis and effusions compared to prior. Hepatobiliary: No focal lesion on noncontrast exam. Gallbladder normal. Pancreas: Normal pancreatic parenchymal intensity. No ductal dilatation or inflammation. Spleen: Normal spleen. Adrenals/urinary tract: Adrenal glands and kidneys are normal. Stomach/Bowel: Post gastric bypass anatomy. Water-soluble contrast is evident in the gastric pouch and proximal jejunum. No evidence of leak at the gastrojejunostomy. No evidence of leak elsewhere of the oral contrast. Some contrast does make it to the more distal mid small bowel in the upper pelvis. Again demonstrated moderate volume intraperitoneal free air. The volume free air is increased over the beneath the RIGHT hemidiaphragm (image 24/series 4/coronal). There is a fluid collection in the LEFT upper quadrant with an air-fluid level measuring 7.9 x 5.6 cm (image 13/2). This is more defined collection than on comparison exam. Vascular/Lymphatic: Abdominal aortic normal caliber. No retroperitoneal periportal lymphadenopathy. Musculoskeletal: No aggressive osseous lesion IMPRESSION: 1. Increased organization of fluid collection beneath the LEFT hemidiaphragm is concerning for abscess formation. 2. No evidence active leaking of the water-soluble oral contrast at the gastrojejunostomy or elsewhere proximal GI tract. 3. Moderate volume intraperitoneal free air. Increased free air beneath the RIGHT hemidiaphragm. 4. Dense bibasilar consolidation with increasing atelectasis and effusion. These results will be called to the ordering clinician or representative by the Radiologist Assistant, and communication documented in the PACS or [employer]. Electronically Signed   By: Suzy Bouchard M.D.   On: 10/20/2019 12:15   CT IMAGE GUIDED DRAINAGE BY PERCUTANEOUS CATHETER  Result Date: 10/21/2019  INDICATION: Remote  history of gastric bypass surgery now with COVID and free air and fluid in the LUQ concerning for abscess vs. perforation. EXAM: CT DRAIN PLACEMENT MEDICATIONS: The patient is currently admitted to the hospital and receiving intravenous antibiotics. The antibiotics were administered within an appropriate time frame prior to the initiation of the procedure. ANESTHESIA/SEDATION: Fentanyl 100 mcg IV; Versed 1 mg IV Moderate Sedation Time: 10 minutes The patient was continuously monitored during the procedure by the interventional radiology nurse under my direct supervision. COMPLICATIONS: None immediate. PROCEDURE: Informed written consent was obtained from the patient after a thorough discussion of the procedural risks, benefits and alternatives. All questions were addressed. Maximal Sterile Barrier Technique was utilized including caps, mask, sterile gowns, sterile gloves, sterile drape, hand hygiene and skin antiseptic. A timeout was performed prior to the initiation of the procedure. A planning axial CT scan was performed. The fluid and gas collection in the left upper quadrant was successfully identified. A suitable skin entry site was selected and marked. The overlying skin was sterilely prepped and draped in the standard fashion using chlorhexidine skin prep. Local anesthesia was attained by infiltration with 1% lidocaine. A small dermatotomy was made. Under intermittent CT guidance, an 18 gauge trocar needle was carefully advanced into the fluid and gas collection. A 0.035 wire was then advanced colic and coiled. The needle was removed. The skin tract was dilated to 12 [place]. A Cook 12 [place] all-purpose drainage catheter was advanced over the wire and formed in the collection. Aspiration yields at least 120 mL of mildly turbid green-yellow bilious appearing fluid. The appearance is similar to gastric secretions. Follow-up CT imaging demonstrates a well-positioned drainage catheter. The catheter was connected  to JP bulb suction and secured to the skin with 0 Prolene suture. Sterile bandages were applied. IMPRESSION: Successful placement of a 12 French drainage catheter into the left upper quadrant fluid and gas collection. The aspirated material appears like gastric secretions. A sample was sent for Gram stain and culture. Signed, Criselda Peaches, MD, [facility] Electronically Signed   By: Jacqulynn Cadet M.D.   On: 10/21/2019 16:09   [place] EKG SITE RITE  Result Date: 10/20/2019 If Site Rite image not attached, placement could not be confirmed due to current cardiac rhythm.   Anti-infectives: Anti-infectives (From admission, onward)   Start     Dose/Rate Route Frequency Ordered Stop   10/19/19 1000  remdesivir 100 mg in sodium chloride 0.9 % 100 mL IVPB       "Followed by" Linked Group Details   100 mg 200 mL/hr over 30 Minutes Intravenous Daily 10/18/19 0515 10/23/19 0959   10/18/19 0600  remdesivir 200 mg in sodium chloride 0.9% 250 mL IVPB       "Followed by" Linked Group Details   200 mg 580 mL/hr over 30 Minutes Intravenous Once 10/18/19 0515 10/18/19 0633   10/18/19 0600  piperacillin-tazobactam (ZOSYN) IVPB 3.375 g        3.375 g 12.5 mL/hr over 240 Minutes Intravenous Every 8 hours 10/18/19 0520     10/17/19 2330  piperacillin-tazobactam (ZOSYN) IVPB 3.375 g        3.375 g 100 mL/hr over 30 Minutes Intravenous  Once 10/17/19 2329 10/18/19 0125      Assessment/Plan: Symptomatic anemia Thrombocytosis Chest pain  SIRS - WBC 17.5 from 14.7 post proc - CRP 2.5 Covid positive - Viral multifocal pneumonia Perforated viscus with history of gastric bypass  FEN: IV fluids/Fulls ID: Zosyn 9/8>> day 4; remdesivir 9/8 >>day 4 DVT: SCDs, OK for lovenox Follow-up: TBD  Plan:  -Abd pain better. Adv diet to [employer] -con't IR drain -con't abx   LOS: 4 days    Ralene Ok 10/22/2019

## 2019-10-22 NOTE — Progress Notes (Signed)
Supervising Physician: Jacqulynn Cadet  Patient Status: [facility] - In-pt  Subjective: IR chart check S/p LUQ perc drain placement yesterday. Pt feeling some better per RN.  Objective: Physical Exam: BP 134/90 (BP Location: Left Arm)   Pulse 68   Temp 98.8 F (37.1 C) (Oral)   Resp 19   Ht 5\' 5"  (1.651 m)   Wt 63.3 kg   LMP 10/10/2019   SpO2 98%   BMI 23.22 kg/m  Drain intact, serous output   Current Facility-Administered Medications:  .  0.9 %  sodium chloride infusion, , Intravenous, PRN, Annita Brod, MD, Last Rate: 10 mL/hr at 10/21/19 2042, 250 mL at 10/21/19 2042 .  acetaminophen (TYLENOL) tablet 650 mg, 650 mg, Oral, Q6H PRN, Shela Leff, MD, 650 mg at 10/22/19 0128 .  albuterol (VENTOLIN HFA) 108 (90 Base) MCG/ACT inhaler 2 puff, 2 puff, Inhalation, Q6H PRN, Shela Leff, MD .  Chlorhexidine Gluconate Cloth 2 % PADS 6 each, 6 each, Topical, Daily, Annita Brod, MD, 6 each at 10/21/19 1601 .  enoxaparin (LOVENOX) injection 40 mg, 40 mg, Subcutaneous, Q24H, Ralene Ok, MD, 40 mg at 10/22/19 1015 .  HYDROmorphone (DILAUDID) injection 0.5-2 mg, 0.5-2 mg, Intravenous, Q2H PRN, Armandina Gemma, MD, 1 mg at 10/22/19 3474 .  ondansetron (ZOFRAN) injection 4 mg, 4 mg, Intravenous, Q6H PRN, Shela Leff, MD, 4 mg at 10/22/19 0129 .  pantoprazole (PROTONIX) injection 40 mg, 40 mg, Intravenous, Q12H, Bonnielee Haff, MD, 40 mg at 10/22/19 1012 .  piperacillin-tazobactam (ZOSYN) IVPB 3.375 g, 3.375 g, Intravenous, Q8H, [place], Wandra Feinstein, MD, Last Rate: 12.5 mL/hr at 10/22/19 1013, 3.375 g at 10/22/19 1013 .  promethazine (PHENERGAN) injection 12.5 mg, 12.5 mg, Intravenous, Q6H PRN, Minda Ditto, RPH, 12.5 mg at 10/22/19 0624 .  sodium chloride flush (NS) 0.9 % injection 10-40 mL, 10-40 mL, Intracatheter, PRN, Annita Brod, MD .  sodium chloride flush (NS) 0.9 % injection 5 mL, 5 mL, Intracatheter, Q8H, Jacqulynn Cadet, MD, 5 mL at  10/21/19 2022  Labs: CBC Recent Labs    10/21/19 0411 10/22/19 0500  WBC 14.7* 17.5*  HGB 8.6* 12.3  HCT 27.7* 38.1  PLT 773* 427*   BMET Recent Labs    10/21/19 0411 10/22/19 0500  NA 140 143  K 3.5 5.3*  CL 102 106  CO2 23 27  GLUCOSE 85 220*  BUN 11 36*  CREATININE 0.59 1.29*  CALCIUM 8.2* 8.6*   LFT Recent Labs    10/22/19 0500  PROT 7.1  ALBUMIN 2.5*  AST 24  ALT 27  ALKPHOS 105  BILITOT 0.7   PT/INR No results for input(s): LABPROT, INR in the last 72 hours.   Studies/Results: CT IMAGE GUIDED DRAINAGE BY PERCUTANEOUS CATHETER  Result Date: 10/21/2019 INDICATION: Remote history of gastric bypass surgery now with COVID and free air and fluid in the LUQ concerning for abscess vs. perforation. EXAM: CT DRAIN PLACEMENT MEDICATIONS: The patient is currently admitted to the hospital and receiving intravenous antibiotics. The antibiotics were administered within an appropriate time frame prior to the initiation of the procedure. ANESTHESIA/SEDATION: Fentanyl 100 mcg IV; Versed 1 mg IV Moderate Sedation Time: 10 minutes The patient was continuously monitored during the procedure by the interventional radiology nurse under my direct supervision. COMPLICATIONS: None immediate. PROCEDURE: Informed written consent was obtained from the patient after a thorough discussion of the procedural risks, benefits and alternatives. All questions were addressed. Maximal Sterile Barrier Technique was utilized including caps,  mask, sterile gowns, sterile gloves, sterile drape, hand hygiene and skin antiseptic. A timeout was performed prior to the initiation of the procedure. A planning axial CT scan was performed. The fluid and gas collection in the left upper quadrant was successfully identified. A suitable skin entry site was selected and marked. The overlying skin was sterilely prepped and draped in the standard fashion using chlorhexidine skin prep. Local anesthesia was attained by  infiltration with 1% lidocaine. A small dermatotomy was made. Under intermittent CT guidance, an 18 gauge trocar needle was carefully advanced into the fluid and gas collection. A 0.035 wire was then advanced colic and coiled. The needle was removed. The skin tract was dilated to 12 [place]. A Cook 12 [place] all-purpose drainage catheter was advanced over the wire and formed in the collection. Aspiration yields at least 120 mL of mildly turbid green-yellow bilious appearing fluid. The appearance is similar to gastric secretions. Follow-up CT imaging demonstrates a well-positioned drainage catheter. The catheter was connected to JP bulb suction and secured to the skin with 0 Prolene suture. Sterile bandages were applied. IMPRESSION: Successful placement of a 12 French drainage catheter into the left upper quadrant fluid and gas collection. The aspirated material appears like gastric secretions. A sample was sent for Gram stain and culture. Signed, Criselda Peaches, MD, [facility] Electronically Signed   By: Jacqulynn Cadet M.D.   On: 10/21/2019 16:09   [place] EKG SITE RITE  Result Date: 10/20/2019 If Site Rite image not attached, placement could not be confirmed due to current cardiac rhythm.   Assessment/Plan: S/p LUQ perc drain Cont drain care/flushes as ordered. IR following.    LOS: 4 days   Ascencion Dike PA-C 10/22/2019 12:41 PM

## 2019-10-23 LAB — CBC WITH DIFFERENTIAL/PLATELET
Abs Immature Granulocytes: 0.12 10*3/uL — ABNORMAL HIGH (ref 0.00–0.07)
Basophils Absolute: 0.1 10*3/uL (ref 0.0–0.1)
Basophils Relative: 1 %
Eosinophils Absolute: 0.3 10*3/uL (ref 0.0–0.5)
Eosinophils Relative: 3 %
HCT: 27.9 % — ABNORMAL LOW (ref 36.0–46.0)
Hemoglobin: 8.4 g/dL — ABNORMAL LOW (ref 12.0–15.0)
Immature Granulocytes: 1 %
Lymphocytes Relative: 13 %
Lymphs Abs: 1.4 10*3/uL (ref 0.7–4.0)
MCH: 21.9 pg — ABNORMAL LOW (ref 26.0–34.0)
MCHC: 30.1 g/dL (ref 30.0–36.0)
MCV: 72.8 fL — ABNORMAL LOW (ref 80.0–100.0)
Monocytes Absolute: 0.5 10*3/uL (ref 0.1–1.0)
Monocytes Relative: 5 %
Neutro Abs: 8.1 10*3/uL — ABNORMAL HIGH (ref 1.7–7.7)
Neutrophils Relative %: 77 %
Platelets: 565 10*3/uL — ABNORMAL HIGH (ref 150–400)
RBC: 3.83 MIL/uL — ABNORMAL LOW (ref 3.87–5.11)
RDW: 30.8 % — ABNORMAL HIGH (ref 11.5–15.5)
WBC: 10.4 10*3/uL (ref 4.0–10.5)
nRBC: 0.2 % (ref 0.0–0.2)

## 2019-10-23 LAB — COMPREHENSIVE METABOLIC PANEL
ALT: 9 U/L (ref 0–44)
AST: 19 U/L (ref 15–41)
Albumin: 2.2 g/dL — ABNORMAL LOW (ref 3.5–5.0)
Alkaline Phosphatase: 36 U/L — ABNORMAL LOW (ref 38–126)
Anion gap: 11 (ref 5–15)
BUN: 7 mg/dL (ref 6–20)
CO2: 28 mmol/L (ref 22–32)
Calcium: 7.8 mg/dL — ABNORMAL LOW (ref 8.9–10.3)
Chloride: 100 mmol/L (ref 98–111)
Creatinine, Ser: 0.54 mg/dL (ref 0.44–1.00)
GFR calc Af Amer: >60 mL/min (ref >60)
GFR calc non Af Amer: >60 mL/min (ref >60)
Glucose, Bld: 95 mg/dL (ref 70–99)
Potassium: 2.9 mmol/L — ABNORMAL LOW (ref 3.5–5.1)
Sodium: 139 mmol/L (ref 135–145)
Total Bilirubin: 0.7 mg/dL (ref 0.3–1.2)
Total Protein: 6.7 g/dL (ref 6.5–8.1)

## 2019-10-23 LAB — GLUCOSE, CAPILLARY
Glucose-Capillary: 91 mg/dL (ref 70–99)
Glucose-Capillary: 89 mg/dL (ref 70–99)
Glucose-Capillary: 101 mg/dL — ABNORMAL HIGH (ref 70–99)
Glucose-Capillary: 92 mg/dL (ref 70–99)

## 2019-10-23 LAB — HEMOGLOBIN AND HEMATOCRIT, BLOOD
HCT: 30.5 % — ABNORMAL LOW (ref 36.0–46.0)
Hemoglobin: 9.3 g/dL — ABNORMAL LOW (ref 12.0–15.0)

## 2019-10-23 LAB — C-REACTIVE PROTEIN: CRP: 19.9 mg/dL — ABNORMAL HIGH (ref <1.0)

## 2019-10-23 LAB — D-DIMER, QUANTITATIVE: D-Dimer, Quant: 12.31 ug/mL-FEU — ABNORMAL HIGH (ref 0.00–0.50)

## 2019-10-23 MED ORDER — SUCRALFATE 1 GM/10ML PO SUSP
1.0000 g | Freq: Three times a day (TID) | ORAL | Status: DC
  Administered 2019-10-23 – 2019-10-29 (×26): 1 g via ORAL
  Filled 2019-10-23 (×24): qty 10

## 2019-10-23 MED ORDER — HYDROMORPHONE HCL 1 MG/ML IJ SOLN
1.0000 mg | Freq: Once | INTRAMUSCULAR | Status: AC
  Administered 2019-10-23: 1 mg via INTRAVENOUS
  Filled 2019-10-23: qty 1

## 2019-10-23 MED ORDER — HYDROMORPHONE HCL 1 MG/ML IJ SOLN
1.0000 mg | INTRAMUSCULAR | Status: DC | PRN
  Administered 2019-10-23 – 2019-10-27 (×12): 1 mg via INTRAVENOUS
  Filled 2019-10-23 (×13): qty 1

## 2019-10-23 MED ORDER — OXYCODONE HCL 5 MG PO TABS
5.0000 mg | ORAL_TABLET | ORAL | Status: DC | PRN
  Administered 2019-10-23 – 2019-10-26 (×4): 5 mg via ORAL
  Filled 2019-10-23 (×5): qty 1

## 2019-10-23 MED ORDER — MELATONIN 3 MG PO TABS
6.0000 mg | ORAL_TABLET | Freq: Every evening | ORAL | Status: DC | PRN
  Administered 2019-10-24 – 2019-10-28 (×6): 6 mg via ORAL
  Filled 2019-10-23 (×6): qty 2

## 2019-10-23 MED ORDER — POTASSIUM CHLORIDE 10 MEQ/100ML IV SOLN
10.0000 meq | INTRAVENOUS | Status: AC
  Administered 2019-10-23 (×6): 10 meq via INTRAVENOUS
  Filled 2019-10-23 (×6): qty 100

## 2019-10-23 MED ORDER — ENSURE ENLIVE PO LIQD
237.0000 mL | Freq: Three times a day (TID) | ORAL | Status: DC
  Administered 2019-10-23 – 2019-10-27 (×6): 237 mL via ORAL

## 2019-10-23 NOTE — Progress Notes (Signed)
PROGRESS NOTE    Kendra [name]  RUE:[id number] DOB: 1975-11-11 DOA: 10/17/2019 PCP: System, Pcp Not In    Brief Narrative:  Kendra [name] is a 44 year old female with past medical history notable for gastric bypass 2009, unvaccinated against Covid-19 who presented to the ED on 10/17/2019 with progressive abdominal pain and Covid-like symptoms for 2 weeks.  Patient was noted to be Covid-19 positive with melanotic stool with a hemoglobin 8.2.  CT abdomen/pelvis notable for multiple areas of free air and organized collection posterior to the uterus consistent with perforated viscus.  Patient was started on IV PPI, IV antibiotics with general surgery consultation.  Patient was admitted to the hospital service.  In regards to Covid-19 viral infection, patient not very symptomatic.  She is on room air and complete a 5-day course of remdesivir.  Patient has been followed by general surgery in terms of her perforated viscus.  Patient underwent IR guided drain placement on 10/21/2019.  Continues on full liquid diet, IV PPI twice daily and Carafate.  Pain slightly worse this morning, Dilaudid dose increased and started on oral pain medications.   Assessment & Plan:   Principal Problem:   Perforated viscus Active Problems:   History of gastric bypass   SIRS (systemic inflammatory response syndrome) (HCC)   Pneumonia due to COVID-19 virus   Anemia   AKI (acute kidney injury) ([place])   Hyperglycemia   Suspected perforated bleeding anastomotic ulcer Patient presenting to the ED with progressive abdominal discomfort over the past 2 weeks.  History of gastric bypass 2009.  CT abdomen/pelvis with multiple foci free air throughout abdomen consistent with perforated viscus, site of perforation not definitively noted but in the setting of gastric bypass, perforated marginal ulcer is considered. --General surgery following, appreciate assistance --s/p IR LUQ drain placed 9/11; output 106mL past 24h --Full  liquid diet, further advancement per general surgery --Protonix 40 mg IV every 12 hours --Carafate 1 g p.o. TID/HS --Zosyn 3.375 g IV every 8 hours (Start: 9/7) --Oxycodone 5 mg p.o. every 4 hours prn moderate pain --Pain control with Dilaudid 1 mg IV q2h prn severe breakthrough pain --Continue monitor drain output closely --If pain continues to worsen; may need to consider repeat imaging  Covid-19 viral infection Patient presented to the ED with progressive shortness of breath, weakness, fatigue and loss of appetite.  She was found to be Covid-19 positive on 10/17/2019.  CT notable for groundglass opacities bilateral lung fields consistent with viral Covid-19 infection.  Patient is not hypoxic; with good oxygenation on room air. --Completed 5-day course of remdesivir on 10/22/2019 --Not a candidate for baricitinib or steroids as she is oxygenating well on room air --Albuterol MDI as needed for shortness of breath/wheezing. --Continue airborne/contact isolation precaution  Acute blood loss anemia Hemoglobin dropped to 6.2 on 10/18/2019, transfused 2 unit PRBC. --Hgb 6.2>9.4>8.7>8.6>12.3>8.4 --Repeat hemoglobin this evening --Follow CBC daily --Transfuse for hemoglobin less than 7.0 or active bleeding  Acute renal failure Etiology likely secondary to prerenal with poor oral intake versus ATN from acute blood loss anemia as above.  Was started on IV fluids and transfused with 2 units PRBCs.  Creatinine now normalized. --Closely monitor renal function daily  Type 2 diabetes mellitus Hemoglobin A1c 8.9.  No previous history of diabetes. --Continue insulin sliding scale for coverage --CBGs qAC/HS   DVT prophylaxis: SCDs Code Status: Full code Family Communication: Updated patient extensively at bedside, currently living with father while relocated from [place] temporarily  Disposition Plan:  Status is: Inpatient  Remains inpatient appropriate because:Ongoing diagnostic testing needed  not appropriate for outpatient work up, Unsafe d/c plan, IV treatments appropriate due to intensity of illness or inability to take PO and Inpatient level of care appropriate due to severity of illness   Dispo:  Patient From: Home  Planned Disposition: Home  Expected discharge date: 10/22/19  Medically stable for discharge: No   Consultants:   Interventional radiology  General surgery  Procedures:   Left upper quadrant drain placed by IR 10/21/2019  Antimicrobials:   Zosyn 9/7>>    Subjective: Patient seen and examined at bedside, resting comfortably.  Complained of increasing abdominal discomfort this morning with IV Dilaudid ineffective.  JP drain continues with output, 60 mL past 24 hours.  Patient request advancement of her diet, discussed with her this will be per general surgery's recommendations.  No other questions or concerns at this time.  Denies headache, no fever/chills/night sweats, no nausea/vomiting/diarrhea, no chest pain, palpitations, no shortness of breath.  No acute events overnight per nursing staff.  Objective: Vitals:   10/22/19 2005 10/22/19 2300 10/23/19 0200 10/23/19 0450  BP: 130/80   111/76  Pulse: 79   84  Resp: 18 18 20 18   Temp: 99 F (37.2 C)   98.2 F (36.8 C)  TempSrc: Oral     SpO2: 91%   96%  Weight:      Height:        Intake/Output Summary (Last 24 hours) at 10/23/2019 1357 Last data filed at 10/23/2019 1123 Gross per 24 hour  Intake 382.35 ml  Output 260 ml  Net 122.35 ml   Filed Weights   10/21/19 1522  Weight: 63.3 kg    Examination:  General exam: Appears calm and comfortable  Respiratory system: Clear to auscultation. Respiratory effort normal. Cardiovascular system: S1 & S2 heard, RRR. No JVD, murmurs, rubs, gallops or clicks. No pedal edema. Gastrointestinal system: Abdomen is nondistended, soft and nontender. No organomegaly or masses felt. Normal bowel sounds heard. Central nervous system: Alert and oriented. No  focal neurological deficits. Extremities: Symmetric 5 x 5 power. Skin: No rashes, lesions or ulcers Psychiatry: Judgement and insight appear normal. Mood & affect appropriate.     Data Reviewed: I have personally reviewed following labs and imaging studies  CBC: Recent Labs  Lab 10/19/19 0404 10/20/19 0412 10/21/19 0411 10/22/19 0500 10/23/19 0401  WBC 16.5* 15.2* 14.7* 17.5* 10.4  NEUTROABS 15.0* 13.6* 12.5* 16.3* 8.1*  HGB 9.4* 8.7* 8.6* 12.3 8.4*  HCT 31.2* 27.8* 27.7* 38.1 27.9*  MCV 74.5* 74.3* 76.3* 74.9* 72.8*  PLT 602* 769* 773* 427* 631*   Basic Metabolic Panel: Recent Labs  Lab 10/19/19 0404 10/20/19 0412 10/21/19 0411 10/22/19 0500 10/23/19 0401  NA 140 141 140 143 139  K 3.9 3.2* 3.5 5.3* 2.9*  CL 107 105 102 106 100  CO2 22 24 23 27 28   GLUCOSE 104* 93 85 220* 95  BUN 12 13 11  36* 7  CREATININE 0.59 0.60 0.59 1.29* 0.54  CALCIUM 8.4* 8.1* 8.2* 8.6* 7.8*  MG  --   --  2.0  --   --    GFR: Estimated Creatinine Clearance: 80.8 mL/min (by C-G formula based on SCr of 0.54 mg/dL). Liver Function Tests: Recent Labs  Lab 10/19/19 0404 10/20/19 0412 10/21/19 0411 10/22/19 0500 10/23/19 0401  AST 18 17 16 24 19   ALT 9 9 9 27 9   ALKPHOS 40 42 47 105 36*  BILITOT 1.0 0.8 0.8 0.7 0.7  PROT 7.6 6.9 7.0 7.1 6.7  ALBUMIN 2.8* 2.5* 2.5* 2.5* 2.2*   Recent Labs  Lab 10/17/19 1723  LIPASE 20   No results for input(s): AMMONIA in the last 168 hours. Coagulation Profile: Recent Labs  Lab 10/18/19 0040  INR 1.4*   Cardiac Enzymes: No results for input(s): CKTOTAL, CKMB, CKMBINDEX, TROPONINI in the last 168 hours. BNP (last 3 results) No results for input(s): PROBNP in the last 8760 hours. HbA1C: Recent Labs    10/22/19 0500  HGBA1C 8.9*   CBG: Recent Labs  Lab 10/22/19 1703 10/22/19 2002 10/23/19 0739 10/23/19 1137  GLUCAP 101* 91 91 89   Lipid Profile: No results for input(s): CHOL, HDL, LDLCALC, TRIG, CHOLHDL, LDLDIRECT in the last  72 hours. Thyroid Function Tests: No results for input(s): TSH, T4TOTAL, FREET4, T3FREE, THYROIDAB in the last 72 hours. Anemia Panel: No results for input(s): VITAMINB12, FOLATE, FERRITIN, TIBC, IRON, RETICCTPCT in the last 72 hours. Sepsis Labs: Recent Labs  Lab 10/18/19 0540 10/18/19 0739  PROCALCITON QUESTIONABLE RESULTS, RECOMMEND RECOLLECT TO VERIFY 2.13  LATICACIDVEN 1.1 0.9    Recent Results (from the past 240 hour(s))  SARS Coronavirus 2 by RT PCR (hospital order, performed in [facility] hospital lab) Nasopharyngeal Nasopharyngeal Swab     Status: Abnormal   Collection Time: 10/17/19  5:26 PM   Specimen: Nasopharyngeal Swab  Result Value Ref Range Status   SARS Coronavirus 2 POSITIVE (A) NEGATIVE Final    Comment: RESULT CALLED TO, READ BACK BY AND VERIFIED WITH: I.HODGES AT 1950 ON 10/17/19 BY N.THOMPSON (NOTE) SARS-CoV-2 target nucleic acids are DETECTED  SARS-CoV-2 RNA is generally detectable in upper respiratory specimens  during the acute phase of infection.  Positive results are indicative  of the presence of the identified virus, but do not rule out bacterial infection or co-infection with other pathogens not detected by the test.  Clinical correlation with patient history and  other diagnostic information is necessary to determine patient infection status.  The expected result is negative.  Fact Sheet for Patients:   StrictlyIdeas.no   Fact Sheet for Healthcare Providers:   BankingDealers.co.za    This test is not yet approved or cleared by the [place] FDA and  has been authorized for detection and/or diagnosis of SARS-CoV-2 by FDA under an Emergency Use Authorization (EUA).  This EUA will remain in effect (meaning t his test can be used) for the duration of  the COVID-19 declaration under Section 564(b)(1) of the Act, 21 U.S.C. section 360-bbb-3(b)(1), unless the authorization is terminated or revoked  sooner.  Performed at [facility], [place] [address] [place], [place] [address]   Culture, blood (Routine X 2) w Reflex to ID Panel     Status: None (Preliminary result)   Collection Time: 10/18/19  5:30 AM   Specimen: BLOOD  Result Value Ref Range Status   Specimen Description   Final    BLOOD RIGHT ANTECUBITAL Performed at [place] [address] [place], [place] [address]    Special Requests   Final    BOTTLES DRAWN AEROBIC AND ANAEROBIC Blood Culture adequate volume Performed at [place] [address], [place], [place] [address]    Culture   Final    NO GROWTH 4 DAYS Performed at [facility], [place] [address], [place], [place] [address]    Report Status PENDING  Incomplete  Culture, blood (Routine X 2) w Reflex to ID Panel  Status: None (Preliminary result)   Collection Time: 10/18/19  5:40 AM   Specimen: BLOOD RIGHT HAND  Result Value Ref Range Status   Specimen Description   Final    BLOOD RIGHT HAND Performed at [place] [address], [place], [place] [address]    Special Requests   Final    BOTTLES DRAWN AEROBIC AND ANAEROBIC Blood Culture adequate volume Performed at [place] [address], [place], [place] [address]    Culture   Final    NO GROWTH 4 DAYS Performed at [facility], [place] [address] [place], [place] [address]    Report Status PENDING  Incomplete  Aerobic/Anaerobic Culture (surgical/deep wound)     Status: None (Preliminary result)   Collection Time: 10/21/19  3:11 PM   Specimen: Abscess  Result Value Ref Range Status   Specimen Description   Final    ABSCESS Performed at [place] [address], [place], [place] [address]    Special Requests   Final    Normal Performed at [facility], [place] [address], [place], [place] [address]    Gram Stain   Final    RARE WBC  PRESENT, PREDOMINANTLY PMN NO ORGANISMS SEEN    Culture   Final    CULTURE REINCUBATED FOR BETTER GROWTH Performed at [facility], [place] [address] [place], [place] [address]    Report Status PENDING  Incomplete         Radiology Studies: CT IMAGE GUIDED DRAINAGE BY PERCUTANEOUS CATHETER  Result Date: 10/21/2019 INDICATION: Remote history of gastric bypass surgery now with COVID and free air and fluid in the LUQ concerning for abscess vs. perforation. EXAM: CT DRAIN PLACEMENT MEDICATIONS: The patient is currently admitted to the hospital and receiving intravenous antibiotics. The antibiotics were administered within an appropriate time frame prior to the initiation of the procedure. ANESTHESIA/SEDATION: Fentanyl 100 mcg IV; Versed 1 mg IV Moderate Sedation Time: 10 minutes The patient was continuously monitored during the procedure by the interventional radiology nurse under my direct supervision. COMPLICATIONS: None immediate. PROCEDURE: Informed written consent was obtained from the patient after a thorough discussion of the procedural risks, benefits and alternatives. All questions were addressed. Maximal Sterile Barrier Technique was utilized including caps, mask, sterile gowns, sterile gloves, sterile drape, hand hygiene and skin antiseptic. A timeout was performed prior to the initiation of the procedure. A planning axial CT scan was performed. The fluid and gas collection in the left upper quadrant was successfully identified. A suitable skin entry site was selected and marked. The overlying skin was sterilely prepped and draped in the standard fashion using chlorhexidine skin prep. Local anesthesia was attained by infiltration with 1% lidocaine. A small dermatotomy was made. Under intermittent CT guidance, an 18 gauge trocar needle was carefully advanced into the fluid and gas collection. A 0.035 wire was then advanced colic and coiled. The needle was removed. The skin tract was  dilated to 12 [place]. A Cook 12 [place] all-purpose drainage catheter was advanced over the wire and formed in the collection. Aspiration yields at least 120 mL of mildly turbid green-yellow bilious appearing fluid. The appearance is similar to gastric secretions. Follow-up CT imaging demonstrates a well-positioned drainage catheter. The catheter was connected to JP bulb suction and secured to the skin with 0 Prolene suture. Sterile bandages were applied. IMPRESSION: Successful placement of a 12 French drainage catheter into the left upper quadrant fluid  and gas collection. The aspirated material appears like gastric secretions. A sample was sent for Gram stain and culture. Signed, Criselda Peaches, MD, [facility] Radiology Electronically Signed   By: Jacqulynn Cadet M.D.   On: 10/21/2019 16:09        Scheduled Meds: . Chlorhexidine Gluconate Cloth  6 each Topical Daily  . feeding supplement (ENSURE ENLIVE)  237 mL Oral TID BM  . heparin injection (subcutaneous)  5,000 Units Subcutaneous Q8H  . insulin aspart  0-9 Units Subcutaneous TID WC  . pantoprazole (PROTONIX) IV  40 mg Intravenous Q12H  . sodium chloride flush  5 mL Intracatheter Q8H  . sucralfate  1 g Oral TID WC & HS   Continuous Infusions: . sodium chloride    . piperacillin-tazobactam (ZOSYN)  IV 3.375 g (10/23/19 0911)  . potassium chloride 10 mEq (10/23/19 1305)     LOS: 5 days    Time spent: 39 minutes spent on chart review, discussion with nursing staff, consultants, updating family and interview/physical exam; more than 50% of that time was spent in counseling and/or coordination of care.    Ivi Griffith J [place] DO [facility] Available via Epic secure chat 7am-7pm After these hours, please refer to coverage provider listed on [email or web address] 10/23/2019, 1:57 PM

## 2019-10-23 NOTE — Progress Notes (Signed)
Referring Physician(s): Clent Demark  Supervising Physician: Daryll Brod  Patient Status:  [facility] - In-pt  Chief Complaint: abd pain/left upper abd fluid collection   Subjective: Pt with reported less abd pain since drain placed; no acute changes   Allergies: Patient has no known allergies.  Medications: Prior to Admission medications   Not on File     Vital Signs: BP 111/76 (BP Location: Right Arm)   Pulse 84   Temp 98.2 F (36.8 C)   Resp 18   Ht 5\' 5"  (1.651 m)   Wt 139 lb 8.8 oz (63.3 kg)   LMP 10/10/2019   SpO2 96%   BMI 23.22 kg/m   Physical Exam LUQ drain intact, OP 60 cc clear, light yellow fluid; drain being flushed by nursing  Imaging: CT ABDOMEN WO CONTRAST  Result Date: 10/20/2019 CLINICAL DATA:  History of gastric bypass 2009. Intraperitoneal free air on CT 10/17/2019. EXAM: CT ABDOMEN WITHOUT CONTRAST TECHNIQUE: Multidetector CT imaging of the abdomen was performed following the standard protocol without IV contrast. Water-soluble oral contrast administered COMPARISON:  CT 10/17/2019 FINDINGS: Lower chest: Dense bibasilar consolidation and small effusions. Increased atelectasis and effusions compared to prior. Hepatobiliary: No focal lesion on noncontrast exam. Gallbladder normal. Pancreas: Normal pancreatic parenchymal intensity. No ductal dilatation or inflammation. Spleen: Normal spleen. Adrenals/urinary tract: Adrenal glands and kidneys are normal. Stomach/Bowel: Post gastric bypass anatomy. Water-soluble contrast is evident in the gastric pouch and proximal jejunum. No evidence of leak at the gastrojejunostomy. No evidence of leak elsewhere of the oral contrast. Some contrast does make it to the more distal mid small bowel in the upper pelvis. Again demonstrated moderate volume intraperitoneal free air. The volume free air is increased over the beneath the RIGHT hemidiaphragm (image 24/series 4/coronal). There is a fluid collection in the LEFT upper  quadrant with an air-fluid level measuring 7.9 x 5.6 cm (image 13/2). This is more defined collection than on comparison exam. Vascular/Lymphatic: Abdominal aortic normal caliber. No retroperitoneal periportal lymphadenopathy. Musculoskeletal: No aggressive osseous lesion IMPRESSION: 1. Increased organization of fluid collection beneath the LEFT hemidiaphragm is concerning for abscess formation. 2. No evidence active leaking of the water-soluble oral contrast at the gastrojejunostomy or elsewhere proximal GI tract. 3. Moderate volume intraperitoneal free air. Increased free air beneath the RIGHT hemidiaphragm. 4. Dense bibasilar consolidation with increasing atelectasis and effusion. These results will be called to the ordering clinician or representative by the Radiologist Assistant, and communication documented in the PACS or [employer]. Electronically Signed   By: Suzy Bouchard M.D.   On: 10/20/2019 12:15   CT IMAGE GUIDED DRAINAGE BY PERCUTANEOUS CATHETER  Result Date: 10/21/2019 INDICATION: Remote history of gastric bypass surgery now with COVID and free air and fluid in the LUQ concerning for abscess vs. perforation. EXAM: CT DRAIN PLACEMENT MEDICATIONS: The patient is currently admitted to the hospital and receiving intravenous antibiotics. The antibiotics were administered within an appropriate time frame prior to the initiation of the procedure. ANESTHESIA/SEDATION: Fentanyl 100 mcg IV; Versed 1 mg IV Moderate Sedation Time: 10 minutes The patient was continuously monitored during the procedure by the interventional radiology nurse under my direct supervision. COMPLICATIONS: None immediate. PROCEDURE: Informed written consent was obtained from the patient after a thorough discussion of the procedural risks, benefits and alternatives. All questions were addressed. Maximal Sterile Barrier Technique was utilized including caps, mask, sterile gowns, sterile gloves, sterile drape, hand hygiene and  skin antiseptic. A timeout was performed prior to the initiation of  the procedure. A planning axial CT scan was performed. The fluid and gas collection in the left upper quadrant was successfully identified. A suitable skin entry site was selected and marked. The overlying skin was sterilely prepped and draped in the standard fashion using chlorhexidine skin prep. Local anesthesia was attained by infiltration with 1% lidocaine. A small dermatotomy was made. Under intermittent CT guidance, an 18 gauge trocar needle was carefully advanced into the fluid and gas collection. A 0.035 wire was then advanced colic and coiled. The needle was removed. The skin tract was dilated to 12 [place]. A Cook 12 [place] all-purpose drainage catheter was advanced over the wire and formed in the collection. Aspiration yields at least 120 mL of mildly turbid green-yellow bilious appearing fluid. The appearance is similar to gastric secretions. Follow-up CT imaging demonstrates a well-positioned drainage catheter. The catheter was connected to JP bulb suction and secured to the skin with 0 Prolene suture. Sterile bandages were applied. IMPRESSION: Successful placement of a 12 French drainage catheter into the left upper quadrant fluid and gas collection. The aspirated material appears like gastric secretions. A sample was sent for Gram stain and culture. Signed, Criselda Peaches, MD, [place] [facility] Radiology Electronically Signed   By: Jacqulynn Cadet M.D.   On: 10/21/2019 16:09   [place] EKG SITE RITE  Result Date: 10/20/2019 If Site Rite image not attached, placement could not be confirmed due to current cardiac rhythm.   Labs:  CBC: Recent Labs    10/20/19 0412 10/21/19 0411 10/22/19 0500 10/23/19 0401  WBC 15.2* 14.7* 17.5* 10.4  HGB 8.7* 8.6* 12.3 8.4*  HCT 27.8* 27.7* 38.1 27.9*  PLT 769* 773* 427* 565*    COAGS: Recent Labs    10/18/19 0040  INR 1.4*     BMP: Recent Labs    10/20/19 0412 10/21/19 0411 10/22/19 0500 10/23/19 0401  NA 141 140 143 139  K 3.2* 3.5 5.3* 2.9*  CL 105 102 106 100  CO2 24 23 27 28   GLUCOSE 93 85 220* 95  BUN 13 11 36* 7  CALCIUM 8.1* 8.2* 8.6* 7.8*  CREATININE 0.60 0.59 1.29* 0.54  GFRNONAA >60 >60 50* >60  GFRAA >60 >60 58* >60    LIVER FUNCTION TESTS: Recent Labs    10/20/19 0412 10/21/19 0411 10/22/19 0500 10/23/19 0401  BILITOT 0.8 0.8 0.7 0.7  AST 17 16 24 19   ALT 9 9 27 9   ALKPHOS 42 47 105 36*  PROT 6.9 7.0 7.1 6.7  ALBUMIN 2.5* 2.5* 2.5* 2.2*    Assessment and Plan: Pt with hx gastric bypass 2009, perf viscus on recent imaging with assoc LUQ fluid collection, s/p drain placement 9/11; COVID+; afebrile; WBC nl; hgb 8.4; drain fluid cx pend; cont drain flushes; once OP less than 10-15 cc/day for 2-3 days or if clinical status worsens obtain f/u CT.   Electronically Signed: D. Rowe Robert, PA-C 10/23/2019, 2:11 PM       Patient ID: Kendra [name], female   DOB: 01/31/1976, 44 y.o.   MRN: [id number]

## 2019-10-23 NOTE — Progress Notes (Signed)
    [id number] pain/COVID positive  Subjective: Pain is much better and drainage is clear from IR drain.    Objective: Vital signs in last 24 hours: Temp:  [98.2 F (36.8 C)-99.8 F (37.7 C)] 98.2 F (36.8 C) (09/13 0450) Pulse Rate:  [79-104] 84 (09/13 0450) Resp:  [18-20] 18 (09/13 0450) BP: (109-130)/(71-80) 111/76 (09/13 0450) SpO2:  [91 %-100 %] 96 % (09/13 0450) Last BM Date: 10/22/19 No intake recorded Urine x 2 recorded Drain 60 Stool x 2 TM 99.8,  K+ 2.9,  CRP 19.9/D-dimer 12.31 WBC 10.4 H/H 8.4/27.9 Platelets 565 IR drain  Intake/Output from previous day: 09/12 0701 - 09/13 0700 In: 5  Out: 60 [Drains:60] Intake/Output this shift: No intake/output data recorded.  General appearance: alert, cooperative and no distress GI: soft, much less tender than last week before the drain, Drainage is clear this AM.   Lab Results:  Recent Labs    10/22/19 0500 10/23/19 0401  WBC 17.5* 10.4  HGB 12.3 8.4*  HCT 38.1 27.9*  PLT 427* 565*    BMET Recent Labs    10/22/19 0500 10/23/19 0401  NA 143 139  K 5.3* 2.9*  CL 106 100  CO2 27 28  GLUCOSE 220* 95  BUN 36* 7  CREATININE 1.29* 0.54  CALCIUM 8.6* 7.8*   PT/INR No results for input(s): LABPROT, INR in the last 72 hours.  Recent Labs  Lab 10/19/19 0404 10/20/19 0412 10/21/19 0411 10/22/19 0500 10/23/19 0401  AST 18 17 16 24 19   ALT 9 9 9 27 9   ALKPHOS 40 42 47 105 36*  BILITOT 1.0 0.8 0.8 0.7 0.7  PROT 7.6 6.9 7.0 7.1 6.7  ALBUMIN 2.8* 2.5* 2.5* 2.5* 2.2*     Lipase     Component Value Date/Time   LIPASE 20 10/17/2019 1723     Medications: . Chlorhexidine Gluconate Cloth  6 each Topical Daily  . heparin injection (subcutaneous)  5,000 Units Subcutaneous Q8H  . insulin aspart  0-9 Units Subcutaneous TID WC  . pantoprazole (PROTONIX) IV  40 mg Intravenous Q12H  . sodium chloride flush  5 mL Intracatheter Q8H  . sucralfate  1 g Oral TID WC & HS    Assessment/Plan Symptomatic  anemia Thrombocytosis  - platelets 565K Chest pain Anemia  - H/H  9.4/31.2(9/9)>>8.48/27.9(9/13)  SIRS - WBC 16.5>>15.2>>14.7>>17.5>>10.4(9/13) - CRP 20.6>>32.9>>31.2>>29.6>>2.5>> 19.9(9/13) Covid positive - Viral multifocal pneumonia Perforated viscus with history of gastric bypass  - IR drain placement 10/21/19     - 60 ml recorded yesterday  FEN: IV fluids/Fulls ID: Zosyn 9/8>> day 6; remdesivir 9/8-9/12/21 DVT: SCDs, OK for lovenox - had 1 dose 9/12 car reorder from our standpoint Follow-up: TBD Pain:  Dilaudid x 5  Plan:  Full liquids, supplements and if she does well transition to soft diet.  Add PO pain meds also.  If she has more pain with PO's we need to back off on them.  We added Carafate this AM. I don't see where we have checked her for H pylori so far either.        LOS: 5 days    Adrena Nakamura 10/23/2019 Please see Amion

## 2019-10-24 LAB — CBC
HCT: 28.8 % — ABNORMAL LOW (ref 36.0–46.0)
Hemoglobin: 8.7 g/dL — ABNORMAL LOW (ref 12.0–15.0)
MCH: 21.1 pg — ABNORMAL LOW (ref 26.0–34.0)
MCHC: 30.2 g/dL (ref 30.0–36.0)
MCV: 69.9 fL — ABNORMAL LOW (ref 80.0–100.0)
Platelets: 549 10*3/uL — ABNORMAL HIGH (ref 150–400)
RBC: 4.12 MIL/uL (ref 3.87–5.11)
RDW: 29.9 % — ABNORMAL HIGH (ref 11.5–15.5)
WBC: 10.6 10*3/uL — ABNORMAL HIGH (ref 4.0–10.5)
nRBC: 0 % (ref 0.0–0.2)

## 2019-10-24 LAB — COMPREHENSIVE METABOLIC PANEL
ALT: 9 U/L (ref 0–44)
AST: 20 U/L (ref 15–41)
Albumin: 2.2 g/dL — ABNORMAL LOW (ref 3.5–5.0)
Alkaline Phosphatase: 33 U/L — ABNORMAL LOW (ref 38–126)
Anion gap: 12 (ref 5–15)
BUN: <5 mg/dL — ABNORMAL LOW (ref 6–20)
CO2: 25 mmol/L (ref 22–32)
Calcium: 7.9 mg/dL — ABNORMAL LOW (ref 8.9–10.3)
Chloride: 94 mmol/L — ABNORMAL LOW (ref 98–111)
Creatinine, Ser: 0.55 mg/dL (ref 0.44–1.00)
GFR calc Af Amer: >60 mL/min (ref >60)
GFR calc non Af Amer: >60 mL/min (ref >60)
Glucose, Bld: 91 mg/dL (ref 70–99)
Potassium: 3.3 mmol/L — ABNORMAL LOW (ref 3.5–5.1)
Sodium: 131 mmol/L — ABNORMAL LOW (ref 135–145)
Total Bilirubin: 0.8 mg/dL (ref 0.3–1.2)
Total Protein: 6.6 g/dL (ref 6.5–8.1)

## 2019-10-24 LAB — GLUCOSE, CAPILLARY
Glucose-Capillary: 92 mg/dL (ref 70–99)
Glucose-Capillary: 91 mg/dL (ref 70–99)
Glucose-Capillary: 90 mg/dL (ref 70–99)
Glucose-Capillary: 120 mg/dL — ABNORMAL HIGH (ref 70–99)

## 2019-10-24 LAB — C-REACTIVE PROTEIN: CRP: 17.6 mg/dL — ABNORMAL HIGH (ref <1.0)

## 2019-10-24 LAB — D-DIMER, QUANTITATIVE: D-Dimer, Quant: 10.35 ug/mL-FEU — ABNORMAL HIGH (ref 0.00–0.50)

## 2019-10-24 LAB — MAGNESIUM: Magnesium: 1.6 mg/dL — ABNORMAL LOW (ref 1.7–2.4)

## 2019-10-24 LAB — H. PYLORI ANTIBODY, IGG: H Pylori IgG: 0.15 Index Value (ref 0.00–0.79)

## 2019-10-24 MED ORDER — POTASSIUM CHLORIDE CRYS ER 20 MEQ PO TBCR
30.0000 meq | EXTENDED_RELEASE_TABLET | ORAL | Status: AC
  Administered 2019-10-24 (×2): 30 meq via ORAL
  Filled 2019-10-24 (×2): qty 1

## 2019-10-24 NOTE — Progress Notes (Signed)
PROGRESS NOTE    Kendra [name]  LDJ:[id number] DOB: 1975-02-13 DOA: 10/17/2019 PCP: System, Pcp Not In    Brief Narrative:  Kendra [name] is a 44 year old female with past medical history notable for gastric bypass 2009, unvaccinated against Covid-19 who presented to the ED on 10/17/2019 with progressive abdominal pain and Covid-like symptoms for 2 weeks.  Patient was noted to be Covid-19 positive with melanotic stool with a hemoglobin 8.2.  CT abdomen/pelvis notable for multiple areas of free air and organized collection posterior to the uterus consistent with perforated viscus.  Patient was started on IV PPI, IV antibiotics with general surgery consultation.  Patient was admitted to the hospital service.  In regards to Covid-19 viral infection, patient not very symptomatic.  She is on room air and complete a 5-day course of remdesivir.  Patient has been followed by general surgery in terms of her perforated viscus.  Patient underwent IR guided drain placement on 10/21/2019.  Continues on full liquid diet, IV PPI twice daily and Carafate.  Pain slightly worse this morning, Dilaudid dose increased and started on oral pain medications.   Assessment & Plan:   Principal Problem:   Perforated viscus Active Problems:   History of gastric bypass   SIRS (systemic inflammatory response syndrome) (HCC)   Pneumonia due to COVID-19 virus   Anemia   AKI (acute kidney injury) ([place])   Hyperglycemia   Suspected perforated bleeding anastomotic ulcer Patient presenting to the ED with progressive abdominal discomfort over the past 2 weeks.  History of gastric bypass 2009.  CT abdomen/pelvis with multiple foci free air throughout abdomen consistent with perforated viscus, site of perforation not definitively noted but in the setting of gastric bypass, perforated marginal ulcer is considered. --General surgery following, appreciate assistance --s/p IR LUQ drain placed 9/11; output 75mL past 24h --Diet  advanced to soft diet by general surgery today --Protonix 40 mg IV every 12 hours --Carafate 1 g p.o. TID/HS --Zosyn 3.375 g IV every 8 hours (Start: 9/7) --Oxycodone 5 mg p.o. every 4 hours prn moderate pain --Pain control with Dilaudid 1 mg IV q2h prn severe breakthrough pain --Continue monitor drain output closely --Surgery plans repeat imaging in 1-2 days  Covid-19 viral infection Patient presented to the ED with progressive shortness of breath, weakness, fatigue and loss of appetite.  She was found to be Covid-19 positive on 10/17/2019.  CT notable for groundglass opacities bilateral lung fields consistent with viral Covid-19 infection.  Patient is not hypoxic; with good oxygenation on room air. --Completed 5-day course of remdesivir on 10/22/2019 --Not a candidate for baricitinib or steroids as she is oxygenating well on room air --Albuterol MDI as needed for shortness of breath/wheezing. --Continue airborne/contact isolation precaution  Acute blood loss anemia Hemoglobin dropped to 6.2 on 10/18/2019, transfused 2 unit PRBC. --Hgb 6.2>9.4>8.7>8.6>12.3>8.4>8.7 --Follow CBC daily --Transfuse for hemoglobin less than 7.0 or active bleeding  Acute renal failure: Resolved Etiology likely secondary to prerenal with poor oral intake versus ATN from acute blood loss anemia as above.  Was started on IV fluids and transfused with 2 units PRBCs.  Creatinine now normalized. --Closely monitor renal function daily  Type 2 diabetes mellitus Hemoglobin A1c 8.9.  No previous history of diabetes. --Continue insulin sliding scale for coverage --CBGs qAC/HS   DVT prophylaxis: SCDs Code Status: Full code Family Communication: Updated patient extensively at bedside, currently living with father while relocated from [place] temporarily  Disposition Plan:  Status is: Inpatient  Remains inpatient appropriate because:Ongoing diagnostic  testing needed not appropriate for outpatient work up, Unsafe d/c  plan, IV treatments appropriate due to intensity of illness or inability to take PO and Inpatient level of care appropriate due to severity of illness   Dispo:  Patient From: Home  Planned Disposition: Home  Expected discharge date: 10/26/19  Medically stable for discharge: No   Consultants:   Interventional radiology  General surgery  Procedures:   Left upper quadrant drain placed by IR 10/21/2019  Antimicrobials:   Zosyn 9/7>>    Subjective: Patient seen and examined at bedside, resting comfortably.  Abdominal pain much improved.  Output from drain decreased since yesterday, 15 mL last 24 hours out.  General surgery advance diet to soft today.  If maintains, likely will repeat imaging in 1-2 days.  Continues on antibiotics.  Oxygenating well on room air. No other questions or concerns at this time.  Denies headache, no fever/chills/night sweats, no nausea/vomiting/diarrhea, no chest pain, palpitations, no shortness of breath.  No acute events overnight per nursing staff.  Objective: Vitals:   10/23/19 0450 10/23/19 1436 10/23/19 1939 10/24/19 0525  BP: 111/76 128/88 115/73 126/79  Pulse: 84 78 98 78  Resp: 18 19 17 17   Temp: 98.2 F (36.8 C) 99.2 F (37.3 C) 99.5 F (37.5 C) 98.6 F (37 C)  TempSrc:   Oral Oral  SpO2: 96% 100% 99% 94%  Weight:      Height:        Intake/Output Summary (Last 24 hours) at 10/24/2019 1223 Last data filed at 10/24/2019 1100 Gross per 24 hour  Intake 1010.8 ml  Output 230 ml  Net 780.8 ml   Filed Weights   10/21/19 1522  Weight: 63.3 kg    Examination:  General exam: Appears calm and comfortable  Respiratory system: Clear to auscultation. Respiratory effort normal.  Oxygenating well on room air Cardiovascular system: S1 & S2 heard, RRR. No JVD, murmurs, rubs, gallops or clicks. No pedal edema. Gastrointestinal system: Abdomen is nondistended, soft and nontender. No organomegaly or masses felt. Normal bowel sounds heard.  Left  upper quadrant drain noted in place. Central nervous system: Alert and oriented. No focal neurological deficits. Extremities: Symmetric 5 x 5 power. Skin: No rashes, lesions or ulcers Psychiatry: Judgement and insight appear normal. Mood & affect appropriate.     Data Reviewed: I have personally reviewed following labs and imaging studies  CBC: Recent Labs  Lab 10/19/19 0404 10/19/19 0404 10/20/19 0412 10/20/19 0412 10/21/19 0411 10/22/19 0500 10/23/19 0401 10/23/19 1900 10/24/19 0440  WBC 16.5*   < > 15.2*  --  14.7* 17.5* 10.4  --  10.6*  NEUTROABS 15.0*  --  13.6*  --  12.5* 16.3* 8.1*  --   --   HGB 9.4*   < > 8.7*   < > 8.6* 12.3 8.4* 9.3* 8.7*  HCT 31.2*   < > 27.8*   < > 27.7* 38.1 27.9* 30.5* 28.8*  MCV 74.5*   < > 74.3*  --  76.3* 74.9* 72.8*  --  69.9*  PLT 602*   < > 769*  --  773* 427* 565*  --  549*   < > = values in this interval not displayed.   Basic Metabolic Panel: Recent Labs  Lab 10/20/19 0412 10/21/19 0411 10/22/19 0500 10/23/19 0401 10/24/19 0440  NA 141 140 143 139 131*  K 3.2* 3.5 5.3* 2.9* 3.3*  CL 105 102 106 100 94*  CO2 24 23 27 28  25  GLUCOSE 93 85 220* 95 91  BUN 13 11 36* 7 <5*  CREATININE 0.60 0.59 1.29* 0.54 0.55  CALCIUM 8.1* 8.2* 8.6* 7.8* 7.9*  MG  --  2.0  --   --  1.6*   GFR: Estimated Creatinine Clearance: 80.8 mL/min (by C-G formula based on SCr of 0.55 mg/dL). Liver Function Tests: Recent Labs  Lab 10/20/19 0412 10/21/19 0411 10/22/19 0500 10/23/19 0401 10/24/19 0440  AST 17 16 24 19 20   ALT 9 9 27 9 9   ALKPHOS 42 47 105 36* 33*  BILITOT 0.8 0.8 0.7 0.7 0.8  PROT 6.9 7.0 7.1 6.7 6.6  ALBUMIN 2.5* 2.5* 2.5* 2.2* 2.2*   Recent Labs  Lab 10/17/19 1723  LIPASE 20   No results for input(s): AMMONIA in the last 168 hours. Coagulation Profile: Recent Labs  Lab 10/18/19 0040  INR 1.4*   Cardiac Enzymes: No results for input(s): CKTOTAL, CKMB, CKMBINDEX, TROPONINI in the last 168 hours. BNP (last 3  results) No results for input(s): PROBNP in the last 8760 hours. HbA1C: Recent Labs    10/22/19 0500  HGBA1C 8.9*   CBG: Recent Labs  Lab 10/23/19 1137 10/23/19 1633 10/23/19 2105 10/24/19 0738 10/24/19 1107  GLUCAP 89 101* 92 92 91   Lipid Profile: No results for input(s): CHOL, HDL, LDLCALC, TRIG, CHOLHDL, LDLDIRECT in the last 72 hours. Thyroid Function Tests: No results for input(s): TSH, T4TOTAL, FREET4, T3FREE, THYROIDAB in the last 72 hours. Anemia Panel: No results for input(s): VITAMINB12, FOLATE, FERRITIN, TIBC, IRON, RETICCTPCT in the last 72 hours. Sepsis Labs: Recent Labs  Lab 10/18/19 0540 10/18/19 0739  PROCALCITON QUESTIONABLE RESULTS, RECOMMEND RECOLLECT TO VERIFY 2.13  LATICACIDVEN 1.1 0.9    Recent Results (from the past 240 hour(s))  SARS Coronavirus 2 by RT PCR (hospital order, performed in [facility] hospital lab) Nasopharyngeal Nasopharyngeal Swab     Status: Abnormal   Collection Time: 10/17/19  5:26 PM   Specimen: Nasopharyngeal Swab  Result Value Ref Range Status   SARS Coronavirus 2 POSITIVE (A) NEGATIVE Final    Comment: RESULT CALLED TO, READ BACK BY AND VERIFIED WITH: I.HODGES AT 1950 ON 10/17/19 BY N.THOMPSON (NOTE) SARS-CoV-2 target nucleic acids are DETECTED  SARS-CoV-2 RNA is generally detectable in upper respiratory specimens  during the acute phase of infection.  Positive results are indicative  of the presence of the identified virus, but do not rule out bacterial infection or co-infection with other pathogens not detected by the test.  Clinical correlation with patient history and  other diagnostic information is necessary to determine patient infection status.  The expected result is negative.  Fact Sheet for Patients:   StrictlyIdeas.no   Fact Sheet for Healthcare Providers:   BankingDealers.co.za    This test is not yet approved or cleared by the [place] FDA and  has  been authorized for detection and/or diagnosis of SARS-CoV-2 by FDA under an Emergency Use Authorization (EUA).  This EUA will remain in effect (meaning t his test can be used) for the duration of  the COVID-19 declaration under Section 564(b)(1) of the Act, 21 U.S.C. section 360-bbb-3(b)(1), unless the authorization is terminated or revoked sooner.  Performed at [facility], [place] [address], [place], [place] [address]   Culture, blood (Routine X 2) w Reflex to ID Panel     Status: None (Preliminary result)   Collection Time: 10/18/19  5:30 AM   Specimen: BLOOD  Result Value Ref Range Status  Specimen Description   Final    BLOOD RIGHT ANTECUBITAL Performed at [place] [address], [place], [place] [address]    Special Requests   Final    BOTTLES DRAWN AEROBIC AND ANAEROBIC Blood Culture adequate volume Performed at [place] [address], [place], [place] [address]    Culture   Final    NO GROWTH 4 DAYS Performed at [facility], [place] [address] [place], [place] [address]    Report Status PENDING  Incomplete  Culture, blood (Routine X 2) w Reflex to ID Panel     Status: None (Preliminary result)   Collection Time: 10/18/19  5:40 AM   Specimen: BLOOD RIGHT HAND  Result Value Ref Range Status   Specimen Description   Final    BLOOD RIGHT HAND Performed at [place] [address], [place], [place] [address]    Special Requests   Final    BOTTLES DRAWN AEROBIC AND ANAEROBIC Blood Culture adequate volume Performed at [facility] [address], [place], [place] [address]    Culture   Final    NO GROWTH 4 DAYS Performed at [facility], [place] [address], [place], [place] [address]    Report Status PENDING  Incomplete  Aerobic/Anaerobic Culture (surgical/deep wound)     Status: None (Preliminary result)   Collection Time: 10/21/19  3:11 PM   Specimen:  Abscess  Result Value Ref Range Status   Specimen Description   Final    ABSCESS Performed at [facility] [address] [place], [place] [address]    Special Requests   Final    Normal Performed at [facility], [place] [address], [place], [place] [address]    Gram Stain   Final    RARE WBC PRESENT, PREDOMINANTLY PMN NO ORGANISMS SEEN    Culture   Final    CULTURE REINCUBATED FOR BETTER GROWTH Performed at [facility], [place] [address] [place], [place] [address]    Report Status PENDING  Incomplete         Radiology Studies: No results found.      Scheduled Meds: . Chlorhexidine Gluconate Cloth  6 each Topical Daily  . feeding supplement (ENSURE ENLIVE)  237 mL Oral TID BM  . heparin injection (subcutaneous)  5,000 Units Subcutaneous Q8H  . insulin aspart  0-9 Units Subcutaneous TID WC  . pantoprazole (PROTONIX) IV  40 mg Intravenous Q12H  . sodium chloride flush  5 mL Intracatheter Q8H  . sucralfate  1 g Oral TID WC & HS   Continuous Infusions: . sodium chloride    . piperacillin-tazobactam (ZOSYN)  IV 3.375 g (10/24/19 0910)     LOS: 6 days    Time spent: 37 minutes spent on chart review, discussion with nursing staff, consultants, updating family and interview/physical exam; more than 50% of that time was spent in counseling and/or coordination of care.    Yehudis Monceaux J [place] DO [facility] Available via Epic secure chat 7am-7pm After these hours, please refer to coverage provider listed on [email or web address] 10/24/2019, 12:23 PM

## 2019-10-24 NOTE — Progress Notes (Addendum)
Referring Physician(s): Clent Demark  Supervising Physician: Jacqulynn Cadet  Patient Status:  [facility] - In-pt  Chief Complaint: abd pain/left upper abd fluid collection   Subjective: Pt with less abd pain; tol diet ok; denies N/V   Allergies: Patient has no known allergies.  Medications: Prior to Admission medications   Not on File     Vital Signs: BP 126/79 (BP Location: Right Arm)    Pulse 78    Temp 98.6 F (37 C) (Oral)    Resp 17    Ht 5\' 5"  (1.651 m)    Wt 139 lb 8.8 oz (63.3 kg)    LMP 10/10/2019    SpO2 94%    BMI 23.22 kg/m   Physical Exam  LUQ drain intact,  OP 30 cc yesterday; about 10 cc today per nurse clear light yellow fluid  Imaging: CT IMAGE GUIDED DRAINAGE BY PERCUTANEOUS CATHETER  Result Date: 10/21/2019 INDICATION: Remote history of gastric bypass surgery now with COVID and free air and fluid in the LUQ concerning for abscess vs. perforation. EXAM: CT DRAIN PLACEMENT MEDICATIONS: The patient is currently admitted to the hospital and receiving intravenous antibiotics. The antibiotics were administered within an appropriate time frame prior to the initiation of the procedure. ANESTHESIA/SEDATION: Fentanyl 100 mcg IV; Versed 1 mg IV Moderate Sedation Time: 10 minutes The patient was continuously monitored during the procedure by the interventional radiology nurse under my direct supervision. COMPLICATIONS: None immediate. PROCEDURE: Informed written consent was obtained from the patient after a thorough discussion of the procedural risks, benefits and alternatives. All questions were addressed. Maximal Sterile Barrier Technique was utilized including caps, mask, sterile gowns, sterile gloves, sterile drape, hand hygiene and skin antiseptic. A timeout was performed prior to the initiation of the procedure. A planning axial CT scan was performed. The fluid and gas collection in the left upper quadrant was successfully identified. A suitable skin entry site was  selected and marked. The overlying skin was sterilely prepped and draped in the standard fashion using chlorhexidine skin prep. Local anesthesia was attained by infiltration with 1% lidocaine. A small dermatotomy was made. Under intermittent CT guidance, an 18 gauge trocar needle was carefully advanced into the fluid and gas collection. A 0.035 wire was then advanced colic and coiled. The needle was removed. The skin tract was dilated to 12 [place]. A Cook 12 [place] all-purpose drainage catheter was advanced over the wire and formed in the collection. Aspiration yields at least 120 mL of mildly turbid green-yellow bilious appearing fluid. The appearance is similar to gastric secretions. Follow-up CT imaging demonstrates a well-positioned drainage catheter. The catheter was connected to JP bulb suction and secured to the skin with 0 Prolene suture. Sterile bandages were applied. IMPRESSION: Successful placement of a 12 French drainage catheter into the left upper quadrant fluid and gas collection. The aspirated material appears like gastric secretions. A sample was sent for Gram stain and culture. Signed, Criselda Peaches, MD, [facility] Radiology Electronically Signed   By: Jacqulynn Cadet M.D.   On: 10/21/2019 16:09   [place] EKG SITE RITE  Result Date: 10/20/2019 If Site Rite image not attached, placement could not be confirmed due to current cardiac rhythm.   Labs:  CBC: Recent Labs    10/21/19 0411 10/21/19 0411 10/22/19 0500 10/23/19 0401 10/23/19 1900 10/24/19 0440  WBC 14.7*  --  17.5* 10.4  --  10.6*  HGB 8.6*   < > 12.3 8.4* 9.3*  8.7*  HCT 27.7*   < > 38.1 27.9* 30.5* 28.8*  PLT 773*  --  427* 565*  --  549*   < > = values in this interval not displayed.    COAGS: Recent Labs    10/18/19 0040  INR 1.4*    BMP: Recent Labs    10/21/19 0411 10/22/19 0500 10/23/19 0401 10/24/19 0440  NA 140 143 139 131*  K 3.5 5.3*  2.9* 3.3*  CL 102 106 100 94*  CO2 23 27 28 25   GLUCOSE 85 220* 95 91  BUN 11 36* 7 <5*  CALCIUM 8.2* 8.6* 7.8* 7.9*  CREATININE 0.59 1.29* 0.54 0.55  GFRNONAA >60 50* >60 >60  GFRAA >60 58* >60 >60    LIVER FUNCTION TESTS: Recent Labs    10/21/19 0411 10/22/19 0500 10/23/19 0401 10/24/19 0440  BILITOT 0.8 0.7 0.7 0.8  AST 16 24 19 20   ALT 9 27 9 9   ALKPHOS 47 105 36* 33*  PROT 7.0 7.1 6.7 6.6  ALBUMIN 2.5* 2.5* 2.2* 2.2*    Assessment and Plan: Pt with hx gastric bypass 2009, perf viscus on recent imaging with assoc LUQ fluid collection, s/p drain placement 9/11; COVID+; afebrile; WBC 10.6, hgb 8.7, K 3.3; creat nl; fl cx pend; cont current tx; if OP cont to decrease obtain f/u CT in next 48-72 hrs   Electronically Signed: D. Rowe Robert, PA-C 10/24/2019, 2:05 PM       Patient ID: Kendra [name], female   DOB: 23-Apr-1975, 44 y.o.   MRN: [id number]

## 2019-10-24 NOTE — Progress Notes (Signed)
    MA:[id number] pain  Subjective: Biggest complaint is her diet, she is still taking some pain medicine, but not really tender on exam.    Objective: Vital signs in last 24 hours: Temp:  [98.6 F (37 C)-99.5 F (37.5 C)] 98.6 F (37 C) (09/14 0525) Pulse Rate:  [78-98] 78 (09/14 0525) Resp:  [17-19] 17 (09/14 0525) BP: (115-128)/(73-88) 126/79 (09/14 0525) SpO2:  [94 %-100 %] 94 % (09/14 0525) Last BM Date: 10/22/19 480 Po 1020 IV Urine x 3 recorded Drain 30 cc Stool none recorded Afebrile, TM 99.5 VSS Na 131, potassium 3.3, chloride 94, magnesium 1.6, alk phos 33, albumin 2.2.  CRP 17.6, WBC 10.6,  H/H 8.7/28.8, platelets 549,000. D-dimer 10.3 Intake/Output from previous day: 09/13 0701 - 09/14 0700 In: 1500 [P.O.:480; I.V.:5; IV Piggyback:1000] Out: 250 [Urine:220; Drains:30] Intake/Output this shift: No intake/output data recorded.  General appearance: alert, cooperative and no distress GI: soft, non-tender; bowel sounds normal; no masses,  no organomegaly, drainage is still clear.  Lab Results:  Recent Labs    10/23/19 0401 10/23/19 0401 10/23/19 1900 10/24/19 0440  WBC 10.4  --   --  10.6*  HGB 8.4*   < > 9.3* 8.7*  HCT 27.9*   < > 30.5* 28.8*  PLT 565*  --   --  549*   < > = values in this interval not displayed.    BMET Recent Labs    10/23/19 0401 10/24/19 0440  NA 139 131*  K 2.9* 3.3*  CL 100 94*  CO2 28 25  GLUCOSE 95 91  BUN 7 <5*  CREATININE 0.54 0.55  CALCIUM 7.8* 7.9*   PT/INR No results for input(s): LABPROT, INR in the last 72 hours.  Recent Labs  Lab 10/20/19 0412 10/21/19 0411 10/22/19 0500 10/23/19 0401 10/24/19 0440  AST $Re'17 16 24 19 20  'oMt$ ALT $R'9 9 27 9 9  'wg$ ALKPHOS 42 47 105 36* 33*  BILITOT 0.8 0.8 0.7 0.7 0.8  PROT 6.9 7.0 7.1 6.7 6.6  ALBUMIN 2.5* 2.5* 2.5* 2.2* 2.2*     Lipase     Component Value Date/Time   LIPASE 20 10/17/2019 1723     Medications: . Chlorhexidine Gluconate Cloth  6 each Topical Daily   . feeding supplement (ENSURE ENLIVE)  237 mL Oral TID BM  . heparin injection (subcutaneous)  5,000 Units Subcutaneous Q8H  . insulin aspart  0-9 Units Subcutaneous TID WC  . pantoprazole (PROTONIX) IV  40 mg Intravenous Q12H  . potassium chloride  30 mEq Oral Q3H  . sodium chloride flush  5 mL Intracatheter Q8H  . sucralfate  1 g Oral TID WC & HS    Assessment/Plan Symptomatic anemia Thrombocytosis  - platelets 565K Chest pain Anemia  - H/H  9.4/31.2(9/9)>>8.48/27.9(9/13)>> 8.7/28.8  SIRS - WBC16.5>>15.2>>14.7>>17.5>>10.4(9/13)>> 10.6 - CRP20.6>>32.9>>31.2>>29.6>>2.5>> 19.9(9/13)>>17.6 Covid positive - Viral multifocal pneumonia Perforated viscus with history of gastric bypass  - PPI twice daily; Carafate 4 times daily  - IR drain placement 10/21/19     - 60 ml recorded yesterday  FEN: IV fluids/Fulls ID: remdesivir 9/8-9/12/21;  Zosyn 9/8>> day7 DVT: SCDs,heparin Follow-up: TBD Pain:  Dilaudid x 3 yesterday; 2 doses this a.m. oxycodone 5 mg x 3 yesterday   Plan:  Advance diet, continue antibiotics for today.  Drainage down from IR drain, Hopefully repeat CT in the next 1-2 days.       LOS: 6 days    Samarah Hogle 10/24/2019 Please see Amion

## 2019-10-25 DIAGNOSIS — Z9884 Bariatric surgery status: Secondary | ICD-10-CM

## 2019-10-25 DIAGNOSIS — K251 Acute gastric ulcer with perforation: Secondary | ICD-10-CM

## 2019-10-25 DIAGNOSIS — L0291 Cutaneous abscess, unspecified: Secondary | ICD-10-CM

## 2019-10-25 DIAGNOSIS — K922 Gastrointestinal hemorrhage, unspecified: Secondary | ICD-10-CM

## 2019-10-25 LAB — BASIC METABOLIC PANEL
Anion gap: 11 (ref 5–15)
BUN: <5 mg/dL — ABNORMAL LOW (ref 6–20)
CO2: 28 mmol/L (ref 22–32)
Calcium: 8.2 mg/dL — ABNORMAL LOW (ref 8.9–10.3)
Chloride: 98 mmol/L (ref 98–111)
Creatinine, Ser: 0.5 mg/dL (ref 0.44–1.00)
GFR calc Af Amer: >60 mL/min (ref >60)
GFR calc non Af Amer: >60 mL/min (ref >60)
Glucose, Bld: 100 mg/dL — ABNORMAL HIGH (ref 70–99)
Potassium: 3.3 mmol/L — ABNORMAL LOW (ref 3.5–5.1)
Sodium: 137 mmol/L (ref 135–145)

## 2019-10-25 LAB — CULTURE, BLOOD (ROUTINE X 2)
Culture: NO GROWTH
Culture: NO GROWTH
Special Requests: ADEQUATE
Special Requests: ADEQUATE

## 2019-10-25 LAB — AEROBIC/ANAEROBIC CULTURE W GRAM STAIN (SURGICAL/DEEP WOUND): Special Requests: NORMAL

## 2019-10-25 LAB — D-DIMER, QUANTITATIVE: D-Dimer, Quant: 11.46 ug/mL-FEU — ABNORMAL HIGH (ref 0.00–0.50)

## 2019-10-25 LAB — CBC
HCT: 28.9 % — ABNORMAL LOW (ref 36.0–46.0)
Hemoglobin: 8.6 g/dL — ABNORMAL LOW (ref 12.0–15.0)
MCH: 20.6 pg — ABNORMAL LOW (ref 26.0–34.0)
MCHC: 29.8 g/dL — ABNORMAL LOW (ref 30.0–36.0)
MCV: 69.1 fL — ABNORMAL LOW (ref 80.0–100.0)
Platelets: 785 10*3/uL — ABNORMAL HIGH (ref 150–400)
RBC: 4.18 MIL/uL (ref 3.87–5.11)
RDW: 29.7 % — ABNORMAL HIGH (ref 11.5–15.5)
WBC: 9.6 10*3/uL (ref 4.0–10.5)
nRBC: 0 % (ref 0.0–0.2)

## 2019-10-25 LAB — GLUCOSE, CAPILLARY
Glucose-Capillary: 97 mg/dL (ref 70–99)
Glucose-Capillary: 97 mg/dL (ref 70–99)
Glucose-Capillary: 130 mg/dL — ABNORMAL HIGH (ref 70–99)
Glucose-Capillary: 98 mg/dL (ref 70–99)

## 2019-10-25 LAB — C-REACTIVE PROTEIN
CRP: 14.6 mg/dL — ABNORMAL HIGH (ref <1.0)
CRP: 12 mg/dL — ABNORMAL HIGH (ref <1.0)

## 2019-10-25 LAB — MAGNESIUM: Magnesium: 1.6 mg/dL — ABNORMAL LOW (ref 1.7–2.4)

## 2019-10-25 LAB — PHOSPHORUS: Phosphorus: 2.5 mg/dL (ref 2.5–4.6)

## 2019-10-25 MED ORDER — MAGNESIUM SULFATE 50 % IJ SOLN
3.0000 g | Freq: Once | INTRAVENOUS | Status: AC
  Administered 2019-10-25: 3 g via INTRAVENOUS
  Filled 2019-10-25: qty 6

## 2019-10-25 MED ORDER — POTASSIUM CHLORIDE 10 MEQ/100ML IV SOLN
10.0000 meq | INTRAVENOUS | Status: AC
  Administered 2019-10-25 (×6): 10 meq via INTRAVENOUS
  Filled 2019-10-25 (×5): qty 100

## 2019-10-25 NOTE — Progress Notes (Signed)
    GY:BWLSLHTDS pain  Subjective: C/o feeling tired and out of it, suspects its due to mediation she received late yesterday. Abdominal pain remains improved, tolerating PO, reports loose, non-bloody stools.   Objective: Vital signs in last 24 hours: Temp:  [98 F (36.7 C)-99.2 F (37.3 C)] 98 F (36.7 C) (09/15 0505) Pulse Rate:  [65-78] 65 (09/15 0505) Resp:  [18-21] 21 (09/15 0505) BP: (115-126)/(70-85) 126/78 (09/15 0505) SpO2:  [93 %-100 %] 95 % (09/15 0505) Last BM Date: 10/24/19  Intake/Output from previous day: 09/14 0701 - 09/15 0700 In: 744.4 [P.O.:480; IV Piggyback:249.4] Out: 230 [Urine:200; Drains:30] Intake/Output this shift: No intake/output data recorded.  General appearance: alert, cooperative and no distress GI: soft, non-tender; bowel sounds normal; no masses,  no organomegaly, drainage is still clear.  Lab Results:  Recent Labs    10/24/19 0440 10/25/19 0408  WBC 10.6* 9.6  HGB 8.7* 8.6*  HCT 28.8* 28.9*  PLT 549* 785*   BMET Recent Labs    10/24/19 0440 10/25/19 0408  NA 131* 137  K 3.3* 3.3*  CL 94* 98  CO2 25 28  GLUCOSE 91 100*  BUN <5* <5*  CREATININE 0.55 0.50  CALCIUM 7.9* 8.2*    Recent Labs  Lab 10/20/19 0412 10/21/19 0411 10/22/19 0500 10/23/19 0401 10/24/19 0440  AST 17 16 24 19 20   ALT 9 9 27 9 9   ALKPHOS 42 47 105 36* 33*  BILITOT 0.8 0.8 0.7 0.7 0.8  PROT 6.9 7.0 7.1 6.7 6.6  ALBUMIN 2.5* 2.5* 2.5* 2.2* 2.2*    Lipase     Component Value Date/Time   LIPASE 20 10/17/2019 1723     Medications: . Chlorhexidine Gluconate Cloth  6 each Topical Daily  . feeding supplement (ENSURE ENLIVE)  237 mL Oral TID BM  . heparin injection (subcutaneous)  5,000 Units Subcutaneous Q8H  . insulin aspart  0-9 Units Subcutaneous TID WC  . pantoprazole (PROTONIX) IV  40 mg Intravenous Q12H  . sodium chloride flush  5 mL Intracatheter Q8H  . 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 She is improving and should not need IV pain medication this regularly.   Plan:  DYS2 diet, D/C antibiotics. Drainage down from IR drain, Hopefully repeat CT in the next 1-2 days per IR, would not recommend a drain injection study this early.   Will need to go home on BID PPI and Carafate.    LOS: 7 days    Jill Alexanders 10/25/2019 Please see Amion

## 2019-10-25 NOTE — Progress Notes (Signed)
PROGRESS NOTE    Aizza Santiago  DHR:[id number] DOB: [birth year] DOA: 10/17/2019 PCP: System, Pcp Not In     Brief Narrative:  Mykia Holton is a 44 year old BF PMHx  gastric bypass 2009,    who presented to the ED on 10/17/2019 with progressive abdominal pain and Covid-like symptoms for 2 weeks.  Patient was noted to be Covid-19 positive with melanotic stool with a hemoglobin 8.2.  CT abdomen/pelvis notable for multiple areas of free air and organized collection posterior to the uterus consistent with perforated viscus.  Patient was started on IV PPI, IV antibiotics with general surgery consultation.  Patient was admitted to the hospital service.  In regards to Covid-19 viral infection, patient not very symptomatic.  She is on room air and complete a 5-day course of remdesivir.  Patient has been followed by general surgery in terms of her perforated viscus.  Patient underwent IR guided drain placement on 10/21/2019.  Continues on full liquid diet, IV PPI twice daily and Carafate.  Pain slightly worse this morning, Dilaudid dose increased and started on oral pain medications.   Subjective: Afebrile overnight   Assessment & Plan: Covid vaccination; unvaccinated   Principal Problem:   Perforated viscus Active Problems:   History of gastric bypass   SIRS (systemic inflammatory response syndrome) (HCC)   Pneumonia due to COVID-19 virus   Anemia   AKI (acute kidney injury) ([place])   Hyperglycemia   Suspected perforated bleeding anastomotic ulcer Patient presenting to the ED with progressive abdominal discomfort over the past 2 weeks.  History of gastric bypass 2009.  CT abdomen/pelvis with multiple foci free air throughout abdomen consistent with perforated viscus, site of perforation not definitively noted but in the setting of gastric bypass, perforated marginal ulcer is considered. --General surgery following, appreciate assistance --s/p IR LUQ drain placed 9/11; output 22mL past  24h --Diet advanced to soft diet by general surgery today --Protonix 40 mg IV every 12 hours --Carafate 1 g p.o. TID/HS --Zosyn 3.375 g IV every 8 hours (Start: 9/7) --Oxycodone 5 mg p.o. every 4 hours prn moderate pain --Pain control with Dilaudid 1 mg IV q2h prn severe breakthrough pain --Continue monitor drain output closely --Surgery plans repeat imaging in 1-2 days  Covid-19 viral infection COVID-19 Labs  Recent Labs    10/23/19 0401 10/24/19 0440 10/25/19 0408  DDIMER 12.31* 10.35* 11.46*  CRP 19.9* 17.6* 14.6*    Lab Results  Component Value Date   SARSCOV2NAA POSITIVE (A) 10/17/2019   Patient presented to the ED with progressive shortness of breath, weakness, fatigue and loss of appetite.  She was found to be Covid-19 positive on 10/17/2019.  CT notable for groundglass opacities bilateral lung fields consistent with viral Covid-19 infection.  Patient is not hypoxic; with good oxygenation on room air. --Completed 5-day course of remdesivir on 10/22/2019 --Not a candidate for baricitinib or steroids as she is oxygenating well on room air --Albuterol MDI as needed for shortness of breath/wheezing. --Continue airborne/contact isolation precaution  Acute blood loss anemia Hemoglobin dropped to 6.2 on 10/18/2019, transfused 2 unit PRBC. Lab Results  Component Value Date   HGB 8.6 (L) 10/25/2019   HGB 8.7 (L) 10/24/2019   HGB 9.3 (L) 10/23/2019   HGB 8.4 (L) 10/23/2019   HGB 12.3 10/22/2019  --Follow CBC daily --Transfuse for hemoglobin less than 7.0 or active bleeding  Acute renal failure: Resolved Etiology likely secondary to prerenal with poor oral intake versus ATN from acute blood loss anemia as above.  Was started on IV fluids and transfused with 2 units PRBCs.  Creatinine now normalized. --Closely monitor renal function daily Lab Results  Component Value Date   CREATININE 0.50 10/25/2019   CREATININE 0.55 10/24/2019   CREATININE 0.54 10/23/2019   CREATININE  1.29 (H) 10/22/2019   CREATININE 0.59 10/21/2019   Type 2 diabetes mellitus uncontrolled with complication* Hemoglobin A1c 8.9.  No previous history of diabetes. --Continue insulin sliding scale for coverage --CBGs qAC/HS  Hypokalemia -Potassium goal> 4 -Potassium IV 60 mEq  Hypomagnesmia -Magnesium goal> 2 -Magnesium IV 3 g   DVT prophylaxis: Heparin subcu Code Status: Full Family Communication:  Status is: Inpatient    Dispo: The patient is from: Home              Anticipated d/c is to: Home              Anticipated d/c date is:??              Patient currently unstable      Consultants:    Procedures/Significant Events:    I have personally reviewed and interpreted all radiology studies and my findings are as above.  VENTILATOR SETTINGS: Room air 9/15 SPO2; 95%  Cultures   Antimicrobials:    Devices    LINES / TUBES:      Continuous Infusions: . sodium chloride    . piperacillin-tazobactam (ZOSYN)  IV 3.375 g (10/25/19 0223)     Objective: Vitals:   10/24/19 0525 10/24/19 1411 10/24/19 1950 10/25/19 0505  BP: 126/79 124/85 115/70 126/78  Pulse: 78 78 74 65  Resp: 17 18 19  (!) 21  Temp: 98.6 F (37 C) 99.2 F (37.3 C) 99 F (37.2 C) 98 F (36.7 C)  TempSrc: Oral  Oral   SpO2: 94% 93% 100% 95%  Weight:      Height:        Intake/Output Summary (Last 24 hours) at 10/25/2019 0850 Last data filed at 10/25/2019 0600 Gross per 24 hour  Intake 504.37 ml  Output 230 ml  Net 274.37 ml   Filed Weights   10/21/19 1522  Weight: 63.3 kg    Examination:  General: A/O x4, No acute respiratory distress, cachectic Eyes: negative scleral hemorrhage, negative anisocoria, negative icterus ENT: Negative Runny nose, negative gingival bleeding, Neck:  Negative scars, masses, torticollis, lymphadenopathy, JVD Lungs: decreased breath sounds decreased/absent breath sounds bilaterally without wheezes or crackles, right side JP drain draining  clear fluid with some opaque substance in the tube. Cardiovascular: Regular rate and rhythm without murmur gallop or rub normal S1 and S2 Abdomen: negative abdominal pain, nondistended, positive soft, bowel sounds, no rebound, no ascites, no appreciable mass Extremities: No significant cyanosis, clubbing, or edema bilateral lower extremities Skin: Negative rashes, lesions, ulcers Psychiatric:  Negative depression, negative anxiety, negative fatigue, negative mania  Central nervous system:  Cranial nerves II through XII intact, tongue/uvula midline, all extremities muscle strength 5/5, sensation intact throughout, f negative dysarthria, negative expressive aphasia, negative receptive aphasia.  .     Data Reviewed: Care during the described time interval was provided by me .  I have reviewed this patient's available data, including medical history, events of note, physical examination, and all test results as part of my evaluation.  CBC: Recent Labs  Lab 10/19/19 0404 10/19/19 0404 10/20/19 2956 10/20/19 0412 10/21/19 0411 10/21/19 0411 10/22/19 0500 10/23/19 0401 10/23/19 1900 10/24/19 0440 10/25/19 0408  WBC 16.5*   < > 15.2*   < >  14.7*  --  17.5* 10.4  --  10.6* 9.6  NEUTROABS 15.0*  --  13.6*  --  12.5*  --  16.3* 8.1*  --   --   --   HGB 9.4*   < > 8.7*   < > 8.6*   < > 12.3 8.4* 9.3* 8.7* 8.6*  HCT 31.2*   < > 27.8*   < > 27.7*   < > 38.1 27.9* 30.5* 28.8* 28.9*  MCV 74.5*   < > 74.3*   < > 76.3*  --  74.9* 72.8*  --  69.9* 69.1*  PLT 602*   < > 769*   < > 773*  --  427* 565*  --  549* 785*   < > = values in this interval not displayed.   Basic Metabolic Panel: Recent Labs  Lab 10/21/19 0411 10/22/19 0500 10/23/19 0401 10/24/19 0440 10/25/19 0408  NA 140 143 139 131* 137  K 3.5 5.3* 2.9* 3.3* 3.3*  CL 102 106 100 94* 98  CO2 23 27 28 25 28   GLUCOSE 85 220* 95 91 100*  BUN 11 36* 7 <5* <5*  CREATININE 0.59 1.29* 0.54 0.55 0.50  CALCIUM 8.2* 8.6* 7.8* 7.9* 8.2*    MG 2.0  --   --  1.6*  --    GFR: Estimated Creatinine Clearance: 80.8 mL/min (by C-G formula based on SCr of 0.5 mg/dL). Liver Function Tests: Recent Labs  Lab 10/20/19 0412 10/21/19 0411 10/22/19 0500 10/23/19 0401 10/24/19 0440  AST 17 16 24 19 20   ALT 9 9 27 9 9   ALKPHOS 42 47 105 36* 33*  BILITOT 0.8 0.8 0.7 0.7 0.8  PROT 6.9 7.0 7.1 6.7 6.6  ALBUMIN 2.5* 2.5* 2.5* 2.2* 2.2*   No results for input(s): LIPASE, AMYLASE in the last 168 hours. No results for input(s): AMMONIA in the last 168 hours. Coagulation Profile: No results for input(s): INR, PROTIME in the last 168 hours. Cardiac Enzymes: No results for input(s): CKTOTAL, CKMB, CKMBINDEX, TROPONINI in the last 168 hours. BNP (last 3 results) No results for input(s): PROBNP in the last 8760 hours. HbA1C: No results for input(s): HGBA1C in the last 72 hours. CBG: Recent Labs  Lab 10/24/19 0738 10/24/19 1107 10/24/19 1615 10/24/19 1951 10/25/19 0734  GLUCAP 92 91 90 120* 97   Lipid Profile: No results for input(s): CHOL, HDL, LDLCALC, TRIG, CHOLHDL, LDLDIRECT in the last 72 hours. Thyroid Function Tests: No results for input(s): TSH, T4TOTAL, FREET4, T3FREE, THYROIDAB in the last 72 hours. Anemia Panel: No results for input(s): VITAMINB12, FOLATE, FERRITIN, TIBC, IRON, RETICCTPCT in the last 72 hours. Sepsis Labs: No results for input(s): PROCALCITON, LATICACIDVEN in the last 168 hours.  Recent Results (from the past 240 hour(s))  SARS Coronavirus 2 by RT PCR (hospital order, performed in [facility] hospital lab) Nasopharyngeal Nasopharyngeal Swab     Status: Abnormal   Collection Time: 10/17/19  5:26 PM   Specimen: Nasopharyngeal Swab  Result Value Ref Range Status   SARS Coronavirus 2 POSITIVE (A) NEGATIVE Final    Comment: RESULT CALLED TO, READ BACK BY AND VERIFIED WITH: I.HODGES AT 1950 ON 10/17/19 BY N.THOMPSON (NOTE) SARS-CoV-2 target nucleic acids are DETECTED  SARS-CoV-2 RNA is generally  detectable in upper respiratory specimens  during the acute phase of infection.  Positive results are indicative  of the presence of the identified virus, but do not rule out bacterial infection or co-infection with other pathogens not detected by  the test.  Clinical correlation with patient history and  other diagnostic information is necessary to determine patient infection status.  The expected result is negative.  Fact Sheet for Patients:   StrictlyIdeas.no   Fact Sheet for Healthcare Providers:   BankingDealers.co.za    This test is not yet approved or cleared by the [place] FDA and  has been authorized for detection and/or diagnosis of SARS-CoV-2 by FDA under an Emergency Use Authorization (EUA).  This EUA will remain in effect (meaning t his test can be used) for the duration of  the COVID-19 declaration under Section 564(b)(1) of the Act, 21 U.S.C. section 360-bbb-3(b)(1), unless the authorization is terminated or revoked sooner.  Performed at [facility], [place] [address], [place], [place] [address]   Culture, blood (Routine X 2) w Reflex to ID Panel     Status: None (Preliminary result)   Collection Time: 10/18/19  5:30 AM   Specimen: BLOOD  Result Value Ref Range Status   Specimen Description   Final    BLOOD RIGHT ANTECUBITAL Performed at [facility] [address] [place], [place] [address]    Special Requests   Final    BOTTLES DRAWN AEROBIC AND ANAEROBIC Blood Culture adequate volume Performed at [facility] [address] [place], [place] [address]    Culture   Final    NO GROWTH 4 DAYS Performed at [facility], [place] [address] [place], [place] [address]    Report Status PENDING  Incomplete  Culture, blood (Routine X 2) w Reflex to ID Panel     Status: None (Preliminary result)   Collection Time: 10/18/19  5:40 AM   Specimen: BLOOD RIGHT  HAND  Result Value Ref Range Status   Specimen Description   Final    BLOOD RIGHT HAND Performed at [facility] [address], [place], [place] [address]    Special Requests   Final    BOTTLES DRAWN AEROBIC AND ANAEROBIC Blood Culture adequate volume Performed at [facility] [address], [place], [place] [address]    Culture   Final    NO GROWTH 4 DAYS Performed at [facility], [place] [address] [place], [place] [address]    Report Status PENDING  Incomplete  Aerobic/Anaerobic Culture (surgical/deep wound)     Status: None (Preliminary result)   Collection Time: 10/21/19  3:11 PM   Specimen: Abscess  Result Value Ref Range Status   Specimen Description   Final    ABSCESS Performed at [facility] [address], [place], [place] [address]    Special Requests   Final    Normal Performed at [facility], [place] [address], [place], [place] [address]    Gram Stain   Final    RARE WBC PRESENT, PREDOMINANTLY PMN NO ORGANISMS SEEN    Culture   Final    CULTURE REINCUBATED FOR BETTER GROWTH Performed at [facility], [place] [address], [place],  [address]    Report Status PENDING  Incomplete         Radiology Studies: No results found.      Scheduled Meds: . Chlorhexidine Gluconate Cloth  6 each Topical Daily  . feeding supplement (ENSURE ENLIVE)  237 mL Oral TID BM  . heparin injection (subcutaneous)  5,000 Units Subcutaneous Q8H  . insulin aspart  0-9 Units Subcutaneous TID WC  . pantoprazole (PROTONIX) IV  40 mg Intravenous Q12H  . sodium chloride flush  5 mL Intracatheter Q8H  . sucralfate  1 g Oral TID WC & HS   Continuous Infusions: . sodium chloride    . piperacillin-tazobactam (ZOSYN)  IV 3.375 g (10/25/19 0223)     LOS: 7 days    Time spent:40 min    Altheria Shadoan, Geraldo Docker, MD [facility] Pager [phone]  If 7PM-7AM, please contact  night-coverage [email or web address] Password [email or web address] 10/25/2019, 8:50 AM

## 2019-10-26 ENCOUNTER — Inpatient Hospital Stay (HOSPITAL_COMMUNITY): Payer: Medicaid - Out of State

## 2019-10-26 LAB — COMPREHENSIVE METABOLIC PANEL
ALT: 11 U/L (ref 0–44)
AST: 24 U/L (ref 15–41)
Albumin: 2.4 g/dL — ABNORMAL LOW (ref 3.5–5.0)
Alkaline Phosphatase: 34 U/L — ABNORMAL LOW (ref 38–126)
Anion gap: 7 (ref 5–15)
BUN: <5 mg/dL — ABNORMAL LOW (ref 6–20)
CO2: 26 mmol/L (ref 22–32)
Calcium: 8.1 mg/dL — ABNORMAL LOW (ref 8.9–10.3)
Chloride: 102 mmol/L (ref 98–111)
Creatinine, Ser: 0.51 mg/dL (ref 0.44–1.00)
GFR calc Af Amer: >60 mL/min (ref >60)
GFR calc non Af Amer: >60 mL/min (ref >60)
Glucose, Bld: 102 mg/dL — ABNORMAL HIGH (ref 70–99)
Potassium: 3.6 mmol/L (ref 3.5–5.1)
Sodium: 135 mmol/L (ref 135–145)
Total Bilirubin: 0.4 mg/dL (ref 0.3–1.2)
Total Protein: 7.3 g/dL (ref 6.5–8.1)

## 2019-10-26 LAB — CBC WITH DIFFERENTIAL/PLATELET
Abs Immature Granulocytes: 0.09 10*3/uL — ABNORMAL HIGH (ref 0.00–0.07)
Basophils Absolute: 0 10*3/uL (ref 0.0–0.1)
Basophils Relative: 0 %
Eosinophils Absolute: 0.1 10*3/uL (ref 0.0–0.5)
Eosinophils Relative: 1 %
HCT: 30 % — ABNORMAL LOW (ref 36.0–46.0)
Hemoglobin: 8.8 g/dL — ABNORMAL LOW (ref 12.0–15.0)
Immature Granulocytes: 1 %
Lymphocytes Relative: 20 %
Lymphs Abs: 2 10*3/uL (ref 0.7–4.0)
MCH: 20.7 pg — ABNORMAL LOW (ref 26.0–34.0)
MCHC: 29.3 g/dL — ABNORMAL LOW (ref 30.0–36.0)
MCV: 70.4 fL — ABNORMAL LOW (ref 80.0–100.0)
Monocytes Absolute: 0.7 10*3/uL (ref 0.1–1.0)
Monocytes Relative: 7 %
Neutro Abs: 6.9 10*3/uL (ref 1.7–7.7)
Neutrophils Relative %: 71 %
Platelets: 640 10*3/uL — ABNORMAL HIGH (ref 150–400)
RBC: 4.26 MIL/uL (ref 3.87–5.11)
RDW: 29.7 % — ABNORMAL HIGH (ref 11.5–15.5)
WBC: 9.8 10*3/uL (ref 4.0–10.5)
nRBC: 0 % (ref 0.0–0.2)

## 2019-10-26 LAB — GLUCOSE, CAPILLARY
Glucose-Capillary: 94 mg/dL (ref 70–99)
Glucose-Capillary: 99 mg/dL (ref 70–99)
Glucose-Capillary: 124 mg/dL — ABNORMAL HIGH (ref 70–99)
Glucose-Capillary: 102 mg/dL — ABNORMAL HIGH (ref 70–99)

## 2019-10-26 LAB — C-REACTIVE PROTEIN: CRP: 8.7 mg/dL — ABNORMAL HIGH (ref <1.0)

## 2019-10-26 LAB — PHOSPHORUS: Phosphorus: 2.6 mg/dL (ref 2.5–4.6)

## 2019-10-26 LAB — LACTATE DEHYDROGENASE: LDH: 178 U/L (ref 98–192)

## 2019-10-26 LAB — MAGNESIUM: Magnesium: 2 mg/dL (ref 1.7–2.4)

## 2019-10-26 LAB — D-DIMER, QUANTITATIVE: D-Dimer, Quant: 12.31 ug/mL-FEU — ABNORMAL HIGH (ref 0.00–0.50)

## 2019-10-26 LAB — FERRITIN: Ferritin: 15 ng/mL (ref 11–307)

## 2019-10-26 MED ORDER — ALUM & MAG HYDROXIDE-SIMETH 200-200-20 MG/5ML PO SUSP
30.0000 mL | ORAL | Status: DC | PRN
  Administered 2019-10-26: 30 mL via ORAL

## 2019-10-26 MED ORDER — LIDOCAINE 5 % EX PTCH
1.0000 | MEDICATED_PATCH | CUTANEOUS | Status: DC
  Administered 2019-10-26 – 2019-10-27 (×2): 1 via TRANSDERMAL
  Filled 2019-10-26 (×4): qty 1

## 2019-10-26 MED ORDER — ALUM & MAG HYDROXIDE-SIMETH 200-200-20 MG/5ML PO SUSP
ORAL | Status: AC
  Filled 2019-10-26: qty 30

## 2019-10-26 MED ORDER — OXYCODONE-ACETAMINOPHEN 5-325 MG PO TABS
1.0000 | ORAL_TABLET | ORAL | Status: DC | PRN
  Administered 2019-10-26 (×2): 2 via ORAL
  Administered 2019-10-27: 1 via ORAL
  Administered 2019-10-27: 2 via ORAL
  Administered 2019-10-28 – 2019-10-29 (×6): 1 via ORAL
  Filled 2019-10-26 (×2): qty 2
  Filled 2019-10-26 (×5): qty 1
  Filled 2019-10-26: qty 2
  Filled 2019-10-26 (×2): qty 1

## 2019-10-26 NOTE — Progress Notes (Addendum)
IR.  Patient with history of gastric bypass complicated by LUQ fluid collection concerning for abscess s/p LUQ drain placement in IR 10/21/2019 by Dr. Laurence Ferrari.  Spoke with patient's RN, Helene Kelp, via telephone. She states LUQ drain with approximately 5 cc output of clear yellow today. Chart check revealed minimal output from drain in past 24 hours. Will obtain CT abdomen for further evaluation (evaluate for possible removal).  IR to follow.   Kendra Graff Iviona Hole, PA-C 10/26/2019, 2:58 PM

## 2019-10-26 NOTE — Progress Notes (Signed)
OE:[id number] pain  Subjective: Pt refusing PO pain medicine and angry at nursing staff demanding IV pain medicine.  Pain is in her back and her drain site.  Upset over the food, and drain being in place and uncomfortable.  She is tolerating the PO's she is taking without any additional pain, or discomfort.  He exam is benign.  Objective: Vital signs in last 24 hours: Temp:  [98.3 F (36.8 C)-98.9 F (37.2 C)] 98.9 F (37.2 C) (09/16 0519) Pulse Rate:  [68-82] 68 (09/16 0519) Resp:  [18-20] 18 (09/16 0519) BP: (114-120)/(72-80) 114/72 (09/16 0519) SpO2:  [96 %-100 %] 96 % (09/16 0519) Last BM Date: 10/25/19 507 IV recorded 5 - other recorded Urine x 2 Drain 28 Stool x 1 Afebrile, VSS,  Labs OK all the numbers are improving    Intake/Output from previous day: 09/15 0701 - 09/16 0700 In: 512.4 [IV Piggyback:507.4] Out: 28 [Drains:28] Intake/Output this shift: No intake/output data recorded.  General appearance: alert, cooperative and no distress GI: soft, non-tender; bowel sounds normal; no masses,  no organomegaly and her only abdominal pain is at her drain site.    Lab Results:  Recent Labs    10/25/19 0408 10/26/19 0421  WBC 9.6 9.8  HGB 8.6* 8.8*  HCT 28.9* 30.0*  PLT 785* 640*    BMET Recent Labs    10/25/19 0408 10/26/19 0421  NA 137 135  K 3.3* 3.6  CL 98 102  CO2 28 26  GLUCOSE 100* 102*  BUN <5* <5*  CREATININE 0.50 0.51  CALCIUM 8.2* 8.1*   PT/INR No results for input(s): LABPROT, INR in the last 72 hours.  Recent Labs  Lab 10/21/19 0411 10/22/19 0500 10/23/19 0401 10/24/19 0440 10/26/19 0421  AST 16 24 19 20 24   ALT 9 27 9 9 11   ALKPHOS 47 105 36* 33* 34*  BILITOT 0.8 0.7 0.7 0.8 0.4  PROT 7.0 7.1 6.7 6.6 7.3  ALBUMIN 2.5* 2.5* 2.2* 2.2* 2.4*     Lipase     Component Value Date/Time   LIPASE 20 10/17/2019 1723     Medications: . Chlorhexidine Gluconate Cloth  6 each Topical Daily  . feeding supplement (ENSURE  ENLIVE)  237 mL Oral TID BM  . heparin injection (subcutaneous)  5,000 Units Subcutaneous Q8H  . insulin aspart  0-9 Units Subcutaneous TID WC  . pantoprazole (PROTONIX) IV  40 mg Intravenous Q12H  . sodium chloride flush  5 mL Intracatheter Q8H  . sucralfate  1 g Oral TID WC & HS    Assessment/Plan Symptomatic anemia Thrombocytosis - platelets 565K Chest pain Anemia - H/H 9.4/31.2(9/9)>>8.48/27.9(9/13)>> 8.7/28.8>>8.8/30  SIRS - WBC16.5>>15.2>>14.7>>17.5>>10.4(9/13)>> 10.6 >>  9.6 >> 9.8 - CRP20.6>>32.9>>31.2>>29.6>>2.5>> 19.9(9/13)>>17.6 >> 8.7 Covid positive - Viral multifocal pneumonia Perforated viscus with history of gastric bypass  - PPI twice daily; Carafate 4 times daily - IR drain placement 10/21/19 - 30 ml recorded yesterday  FEN: IV fluids/D2 >> soft diet  ID: remdesivir 9/8-9/12/21;  Zosyn 9/8>> day8,  DVT: SCDs,heparin Follow-up: TBD  Plan:  I have explained she will not get IV pain medicine unless the PO pain medicine is ineffective. Her pain is in her back and drain site.  I have told her to get OOB and she can walk in the halls now.  We will advance her to a soft diet.  IR will plan to repeat CT when drainage is down to 15 cc.  Continue antibiotics till we have the new CT.  LOS: 8 days    Jhan Conery 10/26/2019 Please see Amion

## 2019-10-26 NOTE — Progress Notes (Signed)
This RN tried to give oxycodone to patient after she stated she was having pain. Patient refused oxycodone stating that it will not work and that she would like IV dilaudid. This RN re educated patient regarding trying P.O. oxycodone before taking IV pain medication since we are trying to work towards a goal of going home. Patient started to become belligerent and verbally abusive to this RN. This RN spoke to Dr.Woods-hospitalist and Dr. Krystal Clark- regarding use of IV dilaudid and oral narcotic pain medications. Dr. Sherral Hammers deferred pain management to surgical team. Dr. Creig Hines told this RN to give P.O. pain medication before I.V. pain medication.

## 2019-10-27 DIAGNOSIS — F1010 Alcohol abuse, uncomplicated: Secondary | ICD-10-CM

## 2019-10-27 LAB — CBC WITH DIFFERENTIAL/PLATELET
Abs Immature Granulocytes: 0.09 10*3/uL — ABNORMAL HIGH (ref 0.00–0.07)
Basophils Absolute: 0 10*3/uL (ref 0.0–0.1)
Basophils Relative: 1 %
Eosinophils Absolute: 0.1 10*3/uL (ref 0.0–0.5)
Eosinophils Relative: 1 %
HCT: 29.2 % — ABNORMAL LOW (ref 36.0–46.0)
Hemoglobin: 8.7 g/dL — ABNORMAL LOW (ref 12.0–15.0)
Immature Granulocytes: 1 %
Lymphocytes Relative: 21 %
Lymphs Abs: 1.9 10*3/uL (ref 0.7–4.0)
MCH: 21 pg — ABNORMAL LOW (ref 26.0–34.0)
MCHC: 29.8 g/dL — ABNORMAL LOW (ref 30.0–36.0)
MCV: 70.5 fL — ABNORMAL LOW (ref 80.0–100.0)
Monocytes Absolute: 0.5 10*3/uL (ref 0.1–1.0)
Monocytes Relative: 6 %
Neutro Abs: 6.2 10*3/uL (ref 1.7–7.7)
Neutrophils Relative %: 70 %
Platelets: 667 10*3/uL — ABNORMAL HIGH (ref 150–400)
RBC: 4.14 MIL/uL (ref 3.87–5.11)
RDW: 29.7 % — ABNORMAL HIGH (ref 11.5–15.5)
WBC: 8.8 10*3/uL (ref 4.0–10.5)
nRBC: 0 % (ref 0.0–0.2)

## 2019-10-27 LAB — COMPREHENSIVE METABOLIC PANEL
ALT: 9 U/L (ref 0–44)
AST: 19 U/L (ref 15–41)
Albumin: 2.3 g/dL — ABNORMAL LOW (ref 3.5–5.0)
Alkaline Phosphatase: 33 U/L — ABNORMAL LOW (ref 38–126)
Anion gap: 7 (ref 5–15)
BUN: <5 mg/dL — ABNORMAL LOW (ref 6–20)
CO2: 27 mmol/L (ref 22–32)
Calcium: 8.2 mg/dL — ABNORMAL LOW (ref 8.9–10.3)
Chloride: 100 mmol/L (ref 98–111)
Creatinine, Ser: 0.56 mg/dL (ref 0.44–1.00)
GFR calc Af Amer: >60 mL/min (ref >60)
GFR calc non Af Amer: >60 mL/min (ref >60)
Glucose, Bld: 96 mg/dL (ref 70–99)
Potassium: 3.8 mmol/L (ref 3.5–5.1)
Sodium: 134 mmol/L — ABNORMAL LOW (ref 135–145)
Total Bilirubin: 0.3 mg/dL (ref 0.3–1.2)
Total Protein: 7.4 g/dL (ref 6.5–8.1)

## 2019-10-27 LAB — D-DIMER, QUANTITATIVE: D-Dimer, Quant: 9.76 ug/mL-FEU — ABNORMAL HIGH (ref 0.00–0.50)

## 2019-10-27 LAB — FERRITIN: Ferritin: 18 ng/mL (ref 11–307)

## 2019-10-27 LAB — GLUCOSE, CAPILLARY
Glucose-Capillary: 84 mg/dL (ref 70–99)
Glucose-Capillary: 90 mg/dL (ref 70–99)
Glucose-Capillary: 88 mg/dL (ref 70–99)
Glucose-Capillary: 94 mg/dL (ref 70–99)

## 2019-10-27 LAB — MAGNESIUM: Magnesium: 1.8 mg/dL (ref 1.7–2.4)

## 2019-10-27 LAB — PHOSPHORUS: Phosphorus: 3.3 mg/dL (ref 2.5–4.6)

## 2019-10-27 LAB — C-REACTIVE PROTEIN: CRP: 6.2 mg/dL — ABNORMAL HIGH (ref <1.0)

## 2019-10-27 LAB — LACTATE DEHYDROGENASE: LDH: 150 U/L (ref 98–192)

## 2019-10-27 MED ORDER — TRAMADOL HCL 50 MG PO TABS
50.0000 mg | ORAL_TABLET | Freq: Once | ORAL | Status: AC
  Administered 2019-10-27: 50 mg via ORAL
  Filled 2019-10-27: qty 1

## 2019-10-27 MED ORDER — SODIUM CHLORIDE 0.9 % IV SOLN
3.0000 g | Freq: Four times a day (QID) | INTRAVENOUS | Status: DC
  Administered 2019-10-27 – 2019-10-29 (×7): 3 g via INTRAVENOUS
  Filled 2019-10-27 (×2): qty 3
  Filled 2019-10-27: qty 8
  Filled 2019-10-27 (×3): qty 3
  Filled 2019-10-27 (×3): qty 8

## 2019-10-27 NOTE — Progress Notes (Signed)
PROGRESS NOTE    Kendra [name]  DJM:[id number] DOB: 31-Dec-1975 DOA: 10/17/2019 PCP: Merryl Hacker, No     Brief Narrative:  Kendra [name] a 44 year old BF PMHx  gastric bypass 2009,    who presented to the ED on 10/17/2019 with progressive abdominal pain and Covid-like symptoms for 2 weeks. Patient was noted to be Covid-19 positive with melanotic stool with a hemoglobin 8.2. CT abdomen/pelvis notable for multiple areas of free air and organized collection posterior to the uterus consistent with perforated viscus. Patient was started on IV PPI, IV antibiotics with general surgery consultation. Patient was admitted to the hospital service.  In regards to Covid-19 viral infection, patient not very symptomatic. She is on room air and complete a 5-day course of remdesivir.  Patient has been followed by general surgery in terms of her perforated viscus. Patient underwent IR guided drain placement on 10/21/2019. Continues on full liquid diet, IV PPI twice daily and Carafate. Pain slightly worse this morning, Dilaudid dose increased and started on oral pain medications.   Subjective: 9/16 afebrile overnight A/O x4, negative S OB, negative CP.  Negative abdominal pain, negative nausea.  Mild pain at the site of insertion of her JP drain.   Assessment & Plan: Covid vaccination; unvaccinated   Principal Problem:   Perforated viscus Active Problems:   History of gastric bypass   SIRS (systemic inflammatory response syndrome) (HCC)   Pneumonia due to COVID-19 virus   Anemia   AKI (acute kidney injury) ([facility])   Hyperglycemia   Suspected perforated bleeding anastomotic ulcer/LUQ gastric abscess  Patient presenting to the ED with progressive abdominal discomfort over the past 2 weeks. History of gastric bypass 2009. CT abdomen/pelvis with multiple foci free air throughout abdomen consistent with perforated viscus, site of perforation not definitively noted but in the setting of gastric  bypass, perforated marginal ulcer is considered. --General surgery following, appreciate assistance --s/p IR LUQ drain placed 9/11; output12mL past 24h --Diet advanced to soft diet by general surgery today --Protonix 40 mg IV every 12 hours --Carafate 1 g p.o. TID/HS --Zosyn 3.375 g IV every 8 hours (Start: 9/7) --Oxycodone 5 mg p.o. every 4 hours prn moderate pain --Pain control with Dilaudid 1 mg IV q2h prn severe breakthrough pain --Continue monitor drain output closely --Surgery plans repeat imaging in 1-2 days -9/16 IR will schedule repeat CT scan abdomen to evaluate abscess drainage.  Covid-19 viral infection COVID-19 Labs  Recent Labs    10/25/19 0408 10/25/19 0408 10/25/19 2113 10/26/19 0421 10/27/19 0340  DDIMER 11.46*  --   --  12.31* 9.76*  FERRITIN  --   --   --  15 18  LDH  --   --   --  178 150  CRP 14.6*   < > 12.0* 8.7* 6.2*   < > = values in this interval not displayed.    Lab Results  Component Value Date   SARSCOV2NAA POSITIVE (A) 10/17/2019  Patient presented to the ED with progressive shortness of breath, weakness, fatigue and loss of appetite. She was found to be Covid-19 positive on 10/17/2019. CT notable for groundglass opacities bilateral lung fields consistent with viral Covid-19 infection. Patient is not hypoxic; with good oxygenation on room air. --Completed 5-day course of Remdesivir on 10/22/2019 --Not a candidate for baricitinib or steroids as she is oxygenating well on room air --Albuterol MDI as needed for shortness of breath/wheezing.  Acute blood loss anemia Hemoglobin dropped to 6.2 on 10/18/2019, transfused 2 unit PRBC. Lab  Results  Component Value Date   HGB 8.7 (L) 10/27/2019   HGB 8.8 (L) 10/26/2019   HGB 8.6 (L) 10/25/2019   HGB 8.7 (L) 10/24/2019   HGB 9.3 (L) 10/23/2019   --Follow CBC daily --Transfuse for hemoglobin less than 7.0 or active bleeding  Acute renal failure: Resolved Etiology likely secondary to prerenal with  poor oral intake versus ATN from acute blood loss anemia as above. Was started on IV fluids and transfused with 2 units PRBCs. Creatinine now normalized. --Closely monitor renal function daily Lab Results  Component Value Date   CREATININE 0.56 10/27/2019   CREATININE 0.51 10/26/2019   CREATININE 0.50 10/25/2019   CREATININE 0.55 10/24/2019   CREATININE 0.54 10/23/2019   Type 2 diabetes mellitus uncontrolled with complication* Hemoglobin A1c 8.9. No previous history of diabetes. --Continue insulin sliding scale for coverage --CBGs qAC/HS  Hypokalemia -Potassium goal> 4 -Potassium IV 60 mEq  Hypomagnesmia -Magnesium goal> 2 -Magnesium IV 3 g   DVT prophylaxis: Heparin subcu Code Status: Full Family Communication:  Status is: Inpatient    Dispo: The patient is from: Home  Anticipated d/c is to: Home  Anticipated d/c date is:??  Patient currently unstable      Consultants:  [facility] IR   Procedures/Significant Events:  9/11 IR placed LUQ drain s/p abscess by Dr. Laurence Ferrari   I have personally reviewed and interpreted all radiology studies and my findings are as above.  VENTILATOR SETTINGS: Room air 9/16 SPO2; 100%  Cultures   Antimicrobials: Anti-infectives (From admission, onward)   Start     Ordered Stop   10/19/19 1000  remdesivir 100 mg in sodium chloride 0.9 % 100 mL IVPB       "Followed by" Linked Group Details   10/18/19 0515 10/22/19 1042   10/18/19 0600  remdesivir 200 mg in sodium chloride 0.9% 250 mL IVPB       "Followed by" Linked Group Details   10/18/19 0515 10/18/19 0633   10/18/19 0600  piperacillin-tazobactam (ZOSYN) IVPB 3.375 g        10/18/19 0520     10/17/19 2330  piperacillin-tazobactam (ZOSYN) IVPB 3.375 g        10/17/19 2329 10/18/19 0125       Devices LUQ abdominal JP drain 9/11>>   LINES / TUBES:      Continuous Infusions: . sodium chloride    . piperacillin-tazobactam  (ZOSYN)  IV 3.375 g (10/27/19 0110)     Objective: Vitals:   10/26/19 0519 10/26/19 1411 10/26/19 2031 10/27/19 0415  BP: 114/72 122/84 131/80 126/85  Pulse: 68 78 67 70  Resp: 18 (!) 22 19 17   Temp: 98.9 F (37.2 C) 98.1 F (36.7 C) 98.7 F (37.1 C) 98.2 F (36.8 C)  TempSrc:  Oral    SpO2: 96% 98% 100% 100%  Weight:      Height:        Intake/Output Summary (Last 24 hours) at 10/27/2019 0819 Last data filed at 10/26/2019 1700 Gross per 24 hour  Intake 1438.12 ml  Output --  Net 1438.12 ml   Filed Weights   10/21/19 1522  Weight: 63.3 kg    Examination:  General: A/O x4, No acute respiratory distress, cachectic Eyes: negative scleral hemorrhage, negative anisocoria, negative icterus ENT: Negative Runny nose, negative gingival bleeding, Neck:  Negative scars, masses, torticollis, lymphadenopathy, JVD Lungs: Clear to auscultation bilaterally without wheezes or crackles Cardiovascular: Regular rate and rhythm without murmur gallop or rub normal S1 and S2 Abdomen:  Positive abdominal pain around insertion site of drain, nondistended, positive soft, bowel sounds, no rebound, no ascites, no appreciable mass Extremities: No significant cyanosis, clubbing, or edema bilateral lower extremities Skin: Negative rashes, lesions, ulcers Psychiatric:  Negative depression, negative anxiety, negative fatigue, negative mania  Central nervous system:  Cranial nerves II through XII intact, tongue/uvula midline, all extremities muscle strength 5/5, sensation intact throughout, negative dysarthria, negative expressive aphasia, negative receptive aphasia.  .     Data Reviewed: Care during the described time interval was provided by me .  I have reviewed this patient's available data, including medical history, events of note, physical examination, and all test results as part of my evaluation.  CBC: Recent Labs  Lab 10/21/19 0411 10/21/19 0411 10/22/19 0500 10/22/19 0500  10/23/19 0401 10/23/19 0401 10/23/19 1900 10/24/19 0440 10/25/19 0408 10/26/19 0421 10/27/19 0340  WBC 14.7*   < > 17.5*   < > 10.4  --   --  10.6* 9.6 9.8 8.8  NEUTROABS 12.5*  --  16.3*  --  8.1*  --   --   --   --  6.9 6.2  HGB 8.6*   < > 12.3   < > 8.4*   < > 9.3* 8.7* 8.6* 8.8* 8.7*  HCT 27.7*   < > 38.1   < > 27.9*   < > 30.5* 28.8* 28.9* 30.0* 29.2*  MCV 76.3*   < > 74.9*   < > 72.8*  --   --  69.9* 69.1* 70.4* 70.5*  PLT 773*   < > 427*   < > 565*  --   --  549* 785* 640* 667*   < > = values in this interval not displayed.   Basic Metabolic Panel: Recent Labs  Lab 10/21/19 0411 10/22/19 0500 10/23/19 0401 10/24/19 0440 10/25/19 0408 10/25/19 0940 10/26/19 0421 10/27/19 0340  NA 140   < > 139 131* 137  --  135 134*  K 3.5   < > 2.9* 3.3* 3.3*  --  3.6 3.8  CL 102   < > 100 94* 98  --  102 100  CO2 23   < > 28 25 28   --  26 27  GLUCOSE 85   < > 95 91 100*  --  102* 96  BUN 11   < > 7 <5* <5*  --  <5* <5*  CREATININE 0.59   < > 0.54 0.55 0.50  --  0.51 0.56  CALCIUM 8.2*   < > 7.8* 7.9* 8.2*  --  8.1* 8.2*  MG 2.0  --   --  1.6*  --  1.6* 2.0 1.8  PHOS  --   --   --   --   --  2.5 2.6 3.3   < > = values in this interval not displayed.   GFR: Estimated Creatinine Clearance: 80.8 mL/min (by C-G formula based on SCr of 0.56 mg/dL). Liver Function Tests: Recent Labs  Lab 10/22/19 0500 10/23/19 0401 10/24/19 0440 10/26/19 0421 10/27/19 0340  AST 24 19 20 24 19   ALT 27 9 9 11 9   ALKPHOS 105 36* 33* 34* 33*  BILITOT 0.7 0.7 0.8 0.4 0.3  PROT 7.1 6.7 6.6 7.3 7.4  ALBUMIN 2.5* 2.2* 2.2* 2.4* 2.3*   No results for input(s): LIPASE, AMYLASE in the last 168 hours. No results for input(s): AMMONIA in the last 168 hours. Coagulation Profile: No results for input(s): INR, PROTIME in the last 168 hours.  Cardiac Enzymes: No results for input(s): CKTOTAL, CKMB, CKMBINDEX, TROPONINI in the last 168 hours. BNP (last 3 results) No results for input(s): PROBNP in the  last 8760 hours. HbA1C: No results for input(s): HGBA1C in the last 72 hours. CBG: Recent Labs  Lab 10/26/19 0756 10/26/19 1139 10/26/19 1603 10/26/19 2028 10/27/19 0730  GLUCAP 94 99 124* 102* 84   Lipid Profile: No results for input(s): CHOL, HDL, LDLCALC, TRIG, CHOLHDL, LDLDIRECT in the last 72 hours. Thyroid Function Tests: No results for input(s): TSH, T4TOTAL, FREET4, T3FREE, THYROIDAB in the last 72 hours. Anemia Panel: Recent Labs    10/26/19 0421 10/27/19 0340  FERRITIN 15 18   Sepsis Labs: No results for input(s): PROCALCITON, LATICACIDVEN in the last 168 hours.  Recent Results (from the past 240 hour(s))  SARS Coronavirus 2 by RT PCR (hospital order, performed in [facility] hospital lab) Nasopharyngeal Nasopharyngeal Swab     Status: Abnormal   Collection Time: 10/17/19  5:26 PM   Specimen: Nasopharyngeal Swab  Result Value Ref Range Status   SARS Coronavirus 2 POSITIVE (A) NEGATIVE Final    Comment: RESULT CALLED TO, READ BACK BY AND VERIFIED WITH: I.HODGES AT 1950 ON 10/17/19 BY N.THOMPSON (NOTE) SARS-CoV-2 target nucleic acids are DETECTED  SARS-CoV-2 RNA is generally detectable in upper respiratory specimens  during the acute phase of infection.  Positive results are indicative  of the presence of the identified virus, but do not rule out bacterial infection or co-infection with other pathogens not detected by the test.  Clinical correlation with patient history and  other diagnostic information is necessary to determine patient infection status.  The expected result is negative.  Fact Sheet for Patients:   StrictlyIdeas.no   Fact Sheet for Healthcare Providers:   BankingDealers.co.za    This test is not yet approved or cleared by the [place] FDA and  has been authorized for detection and/or diagnosis of SARS-CoV-2 by FDA under an Emergency Use Authorization (EUA).  This EUA will remain in effect  (meaning t his test can be used) for the duration of  the COVID-19 declaration under Section 564(b)(1) of the Act, 21 U.S.C. section 360-bbb-3(b)(1), unless the authorization is terminated or revoked sooner.  Performed at [facility], [place] [address] [place], [place] [address]   Culture, blood (Routine X 2) w Reflex to ID Panel     Status: None   Collection Time: 10/18/19  5:30 AM   Specimen: BLOOD  Result Value Ref Range Status   Specimen Description   Final    BLOOD RIGHT ANTECUBITAL Performed at [place] [address] [place], [place] [address]    Special Requests   Final    BOTTLES DRAWN AEROBIC AND ANAEROBIC Blood Culture adequate volume Performed at [facility] [address] [place], [place] [address]    Culture   Final    NO GROWTH 7 DAYS Performed at [facility], [place] [address], [place], [place] [address]    Report Status 10/25/2019 FINAL  Final  Culture, blood (Routine X 2) w Reflex to ID Panel     Status: None   Collection Time: 10/18/19  5:40 AM   Specimen: BLOOD RIGHT HAND  Result Value Ref Range Status   Specimen Description   Final    BLOOD RIGHT HAND Performed at [place] [address], [place],  [address]    Special Requests   Final  BOTTLES DRAWN AEROBIC AND ANAEROBIC Blood Culture adequate volume Performed at [place] [address] [place], [place] [address]    Culture   Final    NO GROWTH 7 DAYS Performed at [facility], [place] [address], [place], [place] [address]    Report Status 10/25/2019 FINAL  Final  Aerobic/Anaerobic Culture (surgical/deep wound)     Status: None   Collection Time: 10/21/19  3:11 PM   Specimen: Abscess  Result Value Ref Range Status   Specimen Description   Final    ABSCESS Performed at [place] [address] [place], [place] [address]    Special Requests    Final    Normal Performed at [facility], [place] [address], [place], [place] [address]    Gram Stain   Final    RARE WBC PRESENT, PREDOMINANTLY PMN NO ORGANISMS SEEN    Culture   Final    MODERATE STREPTOCOCCUS ANGINOSIS NO ANAEROBES ISOLATED Performed at [facility], [place] [address], [place], [place] [address]    Report Status 10/25/2019 FINAL  Final         Radiology Studies: CT ABDOMEN WO CONTRAST  Result Date: 10/26/2019 CLINICAL DATA:  Assess left upper quadrant drain EXAM: CT ABDOMEN WITHOUT CONTRAST TECHNIQUE: Multidetector CT imaging of the abdomen was performed following the standard protocol without IV contrast. COMPARISON:  10/20/2019 FINDINGS: Lower chest: Slight interval improvement in right basilar atelectasis and/or consolidation. No significant change in a large left pleural effusion and dense consolidation of the left lung base. Hepatobiliary: No focal liver abnormality is seen. No gallstones, gallbladder wall thickening, or biliary dilatation. Pancreas: Unremarkable. No pancreatic ductal dilatation or surrounding inflammatory changes. Spleen: Normal in size without focal abnormality. Adrenals/Urinary Tract: Adrenal glands are unremarkable. Kidneys are normal, without renal calculi, focal lesion, or hydronephrosis. Stomach/Bowel: Status post Roux type gastric bypass. No evidence of bowel wall thickening, distention, or inflammatory changes. Vascular/Lymphatic: No significant vascular findings are present. No enlarged abdominal lymph nodes. Other: No abdominal wall hernia or abnormality. There has been interval placement of a percutaneous pigtail drainage catheter in the left upper quadrant, immediately underlying the left hemidiaphragm, within an air and fluid collection, which is significantly reduced in volume compared to prior examination, now flattened and measuring approximately 4.8 x 4.2 x 1.6 cm (series 8, image 22, series 6, image 39).  Musculoskeletal: No acute or significant osseous findings. IMPRESSION: 1. There has been interval placement of a percutaneous pigtail drainage catheter in the left upper quadrant, immediately underlying the left hemidiaphragm, within an air and fluid collection, which is significantly reduced in volume compared to prior examination. 2. Slight interval improvement in right basilar atelectasis and/or consolidation. No significant change in a large left pleural effusion and dense consolidation of the left lung base. 3. Status post Roux type gastric bypass. Electronically Signed   By: Eddie Candle M.D.   On: 10/26/2019 19:11        Scheduled Meds: . Chlorhexidine Gluconate Cloth  6 each Topical Daily  . feeding supplement (ENSURE ENLIVE)  237 mL Oral TID BM  . heparin injection (subcutaneous)  5,000 Units Subcutaneous Q8H  . insulin aspart  0-9 Units Subcutaneous TID WC  . lidocaine  1 patch Transdermal Q24H  . pantoprazole (PROTONIX) IV  40 mg Intravenous Q12H  . sodium chloride flush  5 mL Intracatheter Q8H  . sucralfate  1 g Oral TID WC & HS   Continuous  Infusions: . sodium chloride    . piperacillin-tazobactam (ZOSYN)  IV 3.375 g (10/27/19 0110)     LOS: 9 days    Time spent:40 min    Fynlee Rowlands, Geraldo Docker, MD [facility] Pager [phone]  If 7PM-7AM, please contact night-coverage [email or web address] Password [facility] 10/27/2019, 8:19 AM

## 2019-10-27 NOTE — Discharge Instructions (Signed)
Surgical Drain Home Care Surgical drains are used to remove extra fluid that normally builds up in a surgical wound after surgery. A surgical drain helps to heal a surgical wound. Different kinds of surgical drains include:  Active drains. These drains use suction to pull drainage away from the surgical wound. Drainage flows through a tube to a container outside of the body. With these drains, you need to keep the bulb or the drainage container flat (compressed) at all times, except while you empty it. Flattening the bulb or container creates suction.  Passive drains. These drains allow fluid to drain naturally, by gravity. Drainage flows through a tube to a bandage (dressing) or a container outside of the body. Passive drains do not need to be emptied. A drain is placed during surgery. Right after surgery, drainage is usually bright red and a little thicker than water. The drainage may gradually turn yellow or pink and become thinner. It is likely that your health care provider will remove the drain when the drainage stops or when the amount decreases to 1-2 Tbsp (15-30 mL) during a 24-hour period. Supplies needed:  Tape.  Germ-free cleaning solution (sterile saline).  Cotton swabs.  Split gauze drain sponge: 4 x 4 inches (10 x 10 cm).  Gauze square: 4 x 4 inches (10 x 10 cm). How to care for your surgical drain Care for your drain as told by your health care provider. This is important to help prevent infection. If your drain is placed at your back, or any other hard-to-reach area, ask another person to assist you in performing the following tasks: General care  Keep the skin around the drain dry and covered with a dressing at all times.  Check your drain area every day for signs of infection. Check for: ? Redness, swelling, or pain. ? Pus or a bad smell. ? Cloudy drainage. ? Tenderness or pressure at the drain exit site. Changing the dressing Follow instructions from your health care  provider about how to change your dressing. Change your dressing at least once a day. Change it more often if needed to keep the dressing dry. Make sure you: 1. Gather your supplies. 2. Wash your hands with soap and water before you change your dressing. If soap and water are not available, use hand sanitizer. 3. Remove the old dressing. Avoid using scissors to do that. 4. Wash your hands with soap and water again after removing the old dressing. 5. Use sterile saline to clean your skin around the drain. You may need to use a cotton swab to clean the skin. 6. Place the tube through the slit in a drain sponge. Place the drain sponge so that it covers your wound. 7. Place the gauze square or another drain sponge on top of the drain sponge that is on the wound. Make sure the tube is between those layers. 8. Tape the dressing to your skin. 9. Tape the drainage tube to your skin 1-2 inches (2.5-5 cm) below the place where the tube enters your body. Taping keeps the tube from pulling on any stitches (sutures) that you have. 10. Wash your hands with soap and water. 11. Write down the color of your drainage and how often you change your dressing. How to empty your active drain  1. Make sure that you have a measuring cup that you can empty your drainage into. 2. Wash your hands with soap and water. If soap and water are not available, use hand sanitizer. 3.   Loosen any pins or clips that hold the tube in place. 4. If your health care provider tells you to strip the tube to prevent clots and tube blockages: ? Hold the tube at the skin with one hand. Use your other hand to pinch the tubing with your thumb and first finger. ? Gently move your fingers down the tube while squeezing very lightly. This clears any drainage, clots, or tissue from the tube. ? You may need to do this several times each day to keep the tube clear. Do not pull on the tube. 5. Open the bulb cap or the drain plug. Do not touch the  inside of the cap or the bottom of the plug. 6. Turn the device upside down and gently squeeze. 7. Empty all of the drainage into the measuring cup. 8. Compress the bulb or the container and replace the cap or the plug. To compress the bulb or the container, squeeze it firmly in the middle while you close the cap or plug the container. 9. Write down the amount of drainage that you have in each 24-hour period. If you have less than 2 Tbsp (30 mL) of drainage during 24 hours, contact your health care provider. 10. Flush the drainage down the toilet. 11. Wash your hands with soap and water. Contact a health care provider if:  You have redness, swelling, or pain around your drain area.  You have pus or a bad smell coming from your drain area.  You have a fever or chills.  The skin around your drain is warm to the touch.  The amount of drainage that you have is increasing instead of decreasing.  You have drainage that is cloudy.  There is a sudden stop or a sudden decrease in the amount of drainage that you have.  Your drain tube falls out.  Your active drain does not stay compressed after you empty it. Summary  Surgical drains are used to remove extra fluid that normally builds up in a surgical wound after surgery.  Different kinds of surgical drains include active drains and passive drains. Active drains use suction to pull drainage away from the surgical wound, and passive drains allow fluid to drain naturally.  It is important to care for your drain to prevent infection. If your drain is placed at your back, or any other hard-to-reach area, ask another person to assist you.  Contact your health care provider if you have redness, swelling, or pain around your drain area. This information is not intended to replace advice given to you by your health care provider. Make sure you discuss any questions you have with your health care provider. Document Revised: 03/02/2018 Document  Reviewed: 03/02/2018 Elsevier Patient Education  2020 [employer]  

## 2019-10-27 NOTE — Progress Notes (Addendum)
Subjective: CC: Reports that she felt like "she could run a marathon yesterday". Feels a little more tired today. Only pain is at the drain site. No abdominal pain. She is only eating 2-3 bites of her tray as she does not like the food. Drinking juices mainly and some water. She had some nausea this morning after getting percocet but resolved after getting phenergan. No further nausea and she denies emesis. She reports 2 loose bm's yesterday.  Drain with milky white fluid. She notes she has been drinking juice and water. 2 bites of eggs this morning and 1 bite of potatoes. No milk or other white substances.  Objective: Vital signs in last 24 hours: Temp:  [98.1 F (36.7 C)-98.7 F (37.1 C)] 98.2 F (36.8 C) (09/17 0415) Pulse Rate:  [67-78] 70 (09/17 0415) Resp:  [17-22] 17 (09/17 0415) BP: (122-131)/(80-85) 126/85 (09/17 0415) SpO2:  [98 %-100 %] 100 % (09/17 0415) Last BM Date: 10/25/19  Intake/Output from previous day: 09/16 0701 - 09/17 0700 In: 1438.1 [P.O.:1298; IV Piggyback:140.1] Out: -  Intake/Output this shift: No intake/output data recorded.  PE: Gen: Awake and alert, nad Lungs: Normal rate and effort Abd: Soft, ND, some suprapubic tenderness but otherwise NT to palpation. No peritonitis. Drain in place with non-transparent, milky white fluid in drain.   Lab Results:  Recent Labs    10/26/19 0421 10/27/19 0340  WBC 9.8 8.8  HGB 8.8* 8.7*  HCT 30.0* 29.2*  PLT 640* 667*   BMET Recent Labs    10/26/19 0421 10/27/19 0340  NA 135 134*  K 3.6 3.8  CL 102 100  CO2 26 27  GLUCOSE 102* 96  BUN <5* <5*  CREATININE 0.51 0.56  CALCIUM 8.1* 8.2*   PT/INR No results for input(s): LABPROT, INR in the last 72 hours. CMP     Component Value Date/Time   NA 134 (L) 10/27/2019 0340   K 3.8 10/27/2019 0340   CL 100 10/27/2019 0340   CO2 27 10/27/2019 0340   GLUCOSE 96 10/27/2019 0340   BUN <5 (L) 10/27/2019 0340   CREATININE 0.56 10/27/2019 0340    CALCIUM 8.2 (L) 10/27/2019 0340   PROT 7.4 10/27/2019 0340   ALBUMIN 2.3 (L) 10/27/2019 0340   AST 19 10/27/2019 0340   ALT 9 10/27/2019 0340   ALKPHOS 33 (L) 10/27/2019 0340   BILITOT 0.3 10/27/2019 0340   GFRNONAA >60 10/27/2019 0340   GFRAA >60 10/27/2019 0340   Lipase     Component Value Date/Time   LIPASE 20 10/17/2019 1723       Studies/Results: CT ABDOMEN WO CONTRAST  Result Date: 10/26/2019 CLINICAL DATA:  Assess left upper quadrant drain EXAM: CT ABDOMEN WITHOUT CONTRAST TECHNIQUE: Multidetector CT imaging of the abdomen was performed following the standard protocol without IV contrast. COMPARISON:  10/20/2019 FINDINGS: Lower chest: Slight interval improvement in right basilar atelectasis and/or consolidation. No significant change in a large left pleural effusion and dense consolidation of the left lung base. Hepatobiliary: No focal liver abnormality is seen. No gallstones, gallbladder wall thickening, or biliary dilatation. Pancreas: Unremarkable. No pancreatic ductal dilatation or surrounding inflammatory changes. Spleen: Normal in size without focal abnormality. Adrenals/Urinary Tract: Adrenal glands are unremarkable. Kidneys are normal, without renal calculi, focal lesion, or hydronephrosis. Stomach/Bowel: Status post Roux type gastric bypass. No evidence of bowel wall thickening, distention, or inflammatory changes. Vascular/Lymphatic: No significant vascular findings are present. No enlarged abdominal lymph nodes. Other: No abdominal wall  hernia or abnormality. There has been interval placement of a percutaneous pigtail drainage catheter in the left upper quadrant, immediately underlying the left hemidiaphragm, within an air and fluid collection, which is significantly reduced in volume compared to prior examination, now flattened and measuring approximately 4.8 x 4.2 x 1.6 cm (series 8, image 22, series 6, image 39). Musculoskeletal: No acute or significant osseous findings.  IMPRESSION: 1. There has been interval placement of a percutaneous pigtail drainage catheter in the left upper quadrant, immediately underlying the left hemidiaphragm, within an air and fluid collection, which is significantly reduced in volume compared to prior examination. 2. Slight interval improvement in right basilar atelectasis and/or consolidation. No significant change in a large left pleural effusion and dense consolidation of the left lung base. 3. Status post Roux type gastric bypass. Electronically Signed   By: Eddie Candle M.D.   On: 10/26/2019 19:11    Anti-infectives: Anti-infectives (From admission, onward)   Start     Dose/Rate Route Frequency Ordered Stop   10/19/19 1000  remdesivir 100 mg in sodium chloride 0.9 % 100 mL IVPB       "Followed by" Linked Group Details   100 mg 200 mL/hr over 30 Minutes Intravenous Daily 10/18/19 0515 10/22/19 1042   10/18/19 0600  remdesivir 200 mg in sodium chloride 0.9% 250 mL IVPB       "Followed by" Linked Group Details   200 mg 580 mL/hr over 30 Minutes Intravenous Once 10/18/19 0515 10/18/19 0633   10/18/19 0600  piperacillin-tazobactam (ZOSYN) IVPB 3.375 g        3.375 g 12.5 mL/hr over 240 Minutes Intravenous Every 8 hours 10/18/19 0520     10/17/19 2330  piperacillin-tazobactam (ZOSYN) IVPB 3.375 g        3.375 g 100 mL/hr over 30 Minutes Intravenous  Once 10/17/19 2329 10/18/19 0125       Assessment/Plan Symptomatic anemia Anemia SIRS Covid positive - Viral multifocal pneumonia (positive test 9/7)  Perforated viscus with history of gastric bypass - s/p IR drain placement 9/11. Cx w/ Strep Anginosis. Cont abx  - WBC 8.8 - CT A/P yesterday with improvement of air and fluid collection with drain in place. Keep drain in place given change in character of the fluid. Cont PPI twice daily and Carafate 4 times daily - Possible d/c tomorrow from our standpoint with drain teaching and follow up with one of our bariatric surgeons in  the office in ~1 week.  - Mobilize, Pulm toilet   FEN: soft diet   ID: remdesivir 9/8-9/12/21; Zosyn 9/8 - 9/17. Unasyn 9/17 >>   DVT: SCDs,Heparin subq Follow-up: TBD    LOS: 9 days    Jillyn Ledger , [facility] 10/27/2019, 8:54 AM Please see Amion for pager number during day hours 7:00am-4:30pm

## 2019-10-27 NOTE — Progress Notes (Signed)
Referring Physician(s): White,C  Supervising Physician: Jacqulynn Cadet  Patient Status:  [facility] - In-pt  Chief Complaint: abd pain/left upper abdominal fluid collection   Subjective: Pt doing ok; has some soreness at LUQ drain site; still with fatigue; no worsening abd pain; minimal appetite   Allergies: Patient has no known allergies.  Medications: Prior to Admission medications   Not on File     Vital Signs: BP 126/85 (BP Location: Left Arm)   Pulse 70   Temp 98.2 F (36.8 C)   Resp 17   Ht 5\' 5"  (1.651 m)   Wt 139 lb 8.8 oz (63.3 kg)   LMP 10/10/2019   SpO2 100%   BMI 23.22 kg/m   Physical Exam LUQ drain in place; OP about 10 -15 cc milky white fluid  Imaging: CT ABDOMEN WO CONTRAST  Result Date: 10/26/2019 CLINICAL DATA:  Assess left upper quadrant drain EXAM: CT ABDOMEN WITHOUT CONTRAST TECHNIQUE: Multidetector CT imaging of the abdomen was performed following the standard protocol without IV contrast. COMPARISON:  10/20/2019 FINDINGS: Lower chest: Slight interval improvement in right basilar atelectasis and/or consolidation. No significant change in a large left pleural effusion and dense consolidation of the left lung base. Hepatobiliary: No focal liver abnormality is seen. No gallstones, gallbladder wall thickening, or biliary dilatation. Pancreas: Unremarkable. No pancreatic ductal dilatation or surrounding inflammatory changes. Spleen: Normal in size without focal abnormality. Adrenals/Urinary Tract: Adrenal glands are unremarkable. Kidneys are normal, without renal calculi, focal lesion, or hydronephrosis. Stomach/Bowel: Status post Roux type gastric bypass. No evidence of bowel wall thickening, distention, or inflammatory changes. Vascular/Lymphatic: No significant vascular findings are present. No enlarged abdominal lymph nodes. Other: No abdominal wall hernia or abnormality. There has been interval placement of a percutaneous pigtail drainage catheter in  the left upper quadrant, immediately underlying the left hemidiaphragm, within an air and fluid collection, which is significantly reduced in volume compared to prior examination, now flattened and measuring approximately 4.8 x 4.2 x 1.6 cm (series 8, image 22, series 6, image 39). Musculoskeletal: No acute or significant osseous findings. IMPRESSION: 1. There has been interval placement of a percutaneous pigtail drainage catheter in the left upper quadrant, immediately underlying the left hemidiaphragm, within an air and fluid collection, which is significantly reduced in volume compared to prior examination. 2. Slight interval improvement in right basilar atelectasis and/or consolidation. No significant change in a large left pleural effusion and dense consolidation of the left lung base. 3. Status post Roux type gastric bypass. Electronically Signed   By: Eddie Candle M.D.   On: 10/26/2019 19:11    Labs:  CBC: Recent Labs    10/24/19 0440 10/25/19 0408 10/26/19 0421 10/27/19 0340  WBC 10.6* 9.6 9.8 8.8  HGB 8.7* 8.6* 8.8* 8.7*  HCT 28.8* 28.9* 30.0* 29.2*  PLT 549* 785* 640* 667*    COAGS: Recent Labs    10/18/19 0040  INR 1.4*    BMP: Recent Labs    10/24/19 0440 10/25/19 0408 10/26/19 0421 10/27/19 0340  NA 131* 137 135 134*  K 3.3* 3.3* 3.6 3.8  CL 94* 98 102 100  CO2 25 28 26 27   GLUCOSE 91 100* 102* 96  BUN <5* <5* <5* <5*  CALCIUM 7.9* 8.2* 8.1* 8.2*  CREATININE 0.55 0.50 0.51 0.56  GFRNONAA >60 >60 >60 >60  GFRAA >60 >60 >60 >60    LIVER FUNCTION TESTS: Recent Labs    10/23/19 0401 10/24/19 0440 10/26/19 0421 10/27/19 0340  BILITOT  0.7 0.8 0.4 0.3  AST 19 20 24 19   ALT 9 9 11 9   ALKPHOS 36* 33* 34* 33*  PROT 6.7 6.6 7.3 7.4  ALBUMIN 2.2* 2.2* 2.4* 2.3*    Assessment and Plan: Pt with hx gastric bypass 2009, perf viscus on recent imaging with assoc LUQ fluid collection, s/p drain placement 9/11; COVID+; afebrile; WBC nl; hgb 8.7(8.8), creat nl;  drain fl cx- mod strept anginosis; blood cx neg; CT A/P yesterday:  1. There has been interval placement of a percutaneous pigtail drainage catheter in the left upper quadrant, immediately underlying the left hemidiaphragm, within an air and fluid collection, which is significantly reduced in volume compared to prior examination. 2. Slight interval improvement in right basilar atelectasis and/or consolidation. No significant change in a large left pleural effusion and dense consolidation of the left lung base. 3. Status post Roux type gastric bypass.  Images were reviewed by Dr. Laurence Ferrari; maintain drain, cont irrigation with saline; if pt d/c'd home over weekend rec once daily flush of drain with 5 cc sterile NS, output recording and dressing change every 1-2 days; she will be scheduled for drain injection in IR clinic in 2 weeks; other plans as per [facility]  Electronically Signed: D. Rowe Robert, PA-C 10/27/2019, 11:17 AM       Patient ID: Kendra [name], female   DOB: 08-30-1975, 44 y.o.   MRN: [id number]

## 2019-10-27 NOTE — Progress Notes (Signed)
PROGRESS NOTE    Kendra [name]  BLT:[id number] DOB: [birth year] DOA: 10/17/2019 PCP: Merryl Hacker, No     Brief Narrative:  [name] a 44 year old BF PMHx  gastric bypass 2009,    who presented to the ED on 10/17/2019 with progressive abdominal pain and Covid-like symptoms for 2 weeks. Patient was noted to be Covid-19 positive with melanotic stool with a hemoglobin 8.2. CT abdomen/pelvis notable for multiple areas of free air and organized collection posterior to the uterus consistent with perforated viscus. Patient was started on IV PPI, IV antibiotics with general surgery consultation. Patient was admitted to the hospital service.  In regards to Covid-19 viral infection, patient not very symptomatic. She is on room air and complete a 5-day course of remdesivir.  Patient has been followed by general surgery in terms of her perforated viscus. Patient underwent IR guided drain placement on 10/21/2019. Continues on full liquid diet, IV PPI twice daily and Carafate. Pain slightly worse this morning, Dilaudid dose increased and started on oral pain medications.   Subjective: 9/17 afebrile overnight A/4, negative S OB, negative CP, negative nausea.  Positive left lateral abdominal pain where JP drain inserted.   Assessment & Plan: Covid vaccination; unvaccinated   Principal Problem:   Perforated viscus Active Problems:   History of gastric bypass   SIRS (systemic inflammatory response syndrome) (HCC)   Pneumonia due to COVID-19 virus   Anemia   AKI (acute kidney injury) ([place])   Hyperglycemia   Suspected perforated bleeding anastomotic ulcer/LUQ gastric abscess  Patient presenting to the ED with progressive abdominal discomfort over the past 2 weeks. History of gastric bypass 2009. CT abdomen/pelvis with multiple foci free air throughout abdomen consistent with perforated viscus, site of perforation not definitively noted but in the setting of gastric bypass, perforated  marginal ulcer is considered. --General surgery following, appreciate assistance --s/p IR LUQ drain placed 9/11; output71mL past 24h --Diet advanced to soft diet by general surgery today --Protonix 40 mg IV every 12 hours --Carafate 1 g p.o. TID/HS --Zosyn 3.375 g IV every 8 hours (Start: 9/7) --Oxycodone 5 mg p.o. every 4 hours prn moderate pain --Pain control with Dilaudid 1 mg IV q2h prn severe breakthrough pain --Continue monitor drain output closely --Surgery plans repeat imaging in 1-2 days -9/16 IR will schedule repeat CT scan abdomen; see results below -9/17 per surgery note- s/p IR drain placement 9/11. Cx w/ Strep Anginosis. Cont abx   Keep drain in place given change in character of the fluid. Cont PPI twice daily and Carafate 4 times daily  - Possible d/c tomorrow from our standpoint with drain teaching and follow up with one of our bariatric surgeons in the office in ~1 week.   Zosyn 9/8 - 9/17. Unasyn 9/17 >>   9/17 per IR note;  -Maintain drain, cont irrigation with saline; if pt d/c'd home over weekend rec once daily flush of drain with 5 cc sterile NS, output recording and dressing change every 1-2 days;   -She will be scheduled for drain injection in IR clinic in 2 weeks;  Covid-19 viral infection COVID-19 Labs  Recent Labs    10/25/19 0408 10/25/19 0408 10/25/19 2113 10/26/19 0421 10/27/19 0340  DDIMER 11.46*  --   --  12.31* 9.76*  FERRITIN  --   --   --  15 18  LDH  --   --   --  178 150  CRP 14.6*   < > 12.0* 8.7* 6.2*   < > =  values in this interval not displayed.    Lab Results  Component Value Date   SARSCOV2NAA POSITIVE (A) 10/17/2019  Patient presented to the ED with progressive shortness of breath, weakness, fatigue and loss of appetite. She was found to be Covid-19 positive on 10/17/2019. CT notable for groundglass opacities bilateral lung fields consistent with viral Covid-19 infection. Patient is not hypoxic; with good oxygenation on room  air. --Completed 5-day course of Remdesivir on 10/22/2019 --Not a candidate for baricitinib or steroids as she is oxygenating well on room air --Albuterol MDI as needed for shortness of breath/wheezing.  Acute blood loss anemia Hemoglobin dropped to 6.2 on 10/18/2019, transfused 2 unit PRBC. Lab Results  Component Value Date   HGB 8.7 (L) 10/27/2019   HGB 8.8 (L) 10/26/2019   HGB 8.6 (L) 10/25/2019   HGB 8.7 (L) 10/24/2019   HGB 9.3 (L) 10/23/2019   --Follow CBC daily --Transfuse for hemoglobin less than 7.0 or active bleeding  Acute renal failure: Resolved Etiology likely secondary to prerenal with poor oral intake versus ATN from acute blood loss anemia as above. Was started on IV fluids and transfused with 2 units PRBCs. Creatinine now normalized. --Closely monitor renal function daily Lab Results  Component Value Date   CREATININE 0.56 10/27/2019   CREATININE 0.51 10/26/2019   CREATININE 0.50 10/25/2019   CREATININE 0.55 10/24/2019   CREATININE 0.54 10/23/2019   Type 2 diabetes mellitus uncontrolled with complication* Hemoglobin A1c 8.9. No previous history of diabetes. --Continue insulin sliding scale for coverage --CBGs qAC/HS  Hypokalemia -Potassium goal> 4 -Potassium IV 60 mEq  Hypomagnesmia -Magnesium goal> 2 -Magnesium IV 3 g  EtOH abuse -Patient admitted to significant drinking problem.  Stated would drink 1/5 of liquor in a day.  Starting in the morning -9/17 thiamine 100 mg daily -1/44 folic acid 1 mg daily   DVT prophylaxis: Heparin subcu Code Status: Full Family Communication: 9/18 spoke with Al Pimple (mother) counseled on plan of care answered all questions Status is: Inpatient    Dispo: The patient is from: Home  Anticipated d/c is to: Home  Anticipated d/c date is:??  Patient currently unstable      Consultants:  [facility] IR   Procedures/Significant Events:  9/10 CT ABDOMEN WITHOUT  CONTRAST;Increased organization of fluid collection beneath the LEFT hemidiaphragm is concerning for abscess formation. 2. No evidence active leaking of the water-soluble oral contrast at the gastrojejunostomy or elsewhere proximal GI tract. 3. Moderate volume intraperitoneal free air. Increased free air beneath the RIGHT hemidiaphragm. 4. Dense bibasilar consolidation with increasing atelectasis and effusion. 9/11 IR placed LUQ drain s/p abscess by Dr. Laurence Ferrari;Successful placement of a 12 French drainage catheter into the left upper quadrant fluid and gas collection. The aspirated material appears like gastric secretions. A sample was sent for Gram stain and culture. 9/16 CT ABDOMEN WITHOUT CONTRAST;There has been interval placement of a percutaneous pigtail drainage catheter in the left upper quadrant, immediately underlyingthe left hemidiaphragm, within an air and fluid collection, which is significantly reduced in volume compared to prior examination. -Slight interval improvement in right basilar atelectasis and/or consolidation. No significant change in a large left pleural effusion and dense consolidation of the left lung base. -Status post Roux type gastric bypass.  I have personally reviewed and interpreted all radiology studies and my findings are as above.  VENTILATOR SETTINGS: Room air 9/17 SPO2; 100%  Cultures   Antimicrobials: Anti-infectives (From admission, onward)   Start     Ordered Stop  10/19/19 1000  remdesivir 100 mg in sodium chloride 0.9 % 100 mL IVPB       "Followed by" Linked Group Details   10/18/19 0515 10/22/19 1042   10/18/19 0600  remdesivir 200 mg in sodium chloride 0.9% 250 mL IVPB       "Followed by" Linked Group Details   10/18/19 0515 10/18/19 0633   10/18/19 0600  piperacillin-tazobactam (ZOSYN) IVPB 3.375 g        10/18/19 0520     10/17/19 2330  piperacillin-tazobactam (ZOSYN) IVPB 3.375 g        10/17/19 2329 10/18/19 0125        Devices LUQ abdominal JP drain 9/11>>   LINES / TUBES:      Continuous Infusions: . sodium chloride    . piperacillin-tazobactam (ZOSYN)  IV 3.375 g (10/27/19 5681)     Objective: Vitals:   10/26/19 0519 10/26/19 1411 10/26/19 2031 10/27/19 0415  BP: 114/72 122/84 131/80 126/85  Pulse: 68 78 67 70  Resp: 18 (!) 22 19 17   Temp: 98.9 F (37.2 C) 98.1 F (36.7 C) 98.7 F (37.1 C) 98.2 F (36.8 C)  TempSrc:  Oral    SpO2: 96% 98% 100% 100%  Weight:      Height:        Intake/Output Summary (Last 24 hours) at 10/27/2019 0834 Last data filed at 10/26/2019 1700 Gross per 24 hour  Intake 1438.12 ml  Output --  Net 1438.12 ml   Filed Weights   10/21/19 1522  Weight: 63.3 kg    Examination:  General: A/O x4, No acute respiratory distress, cachectic Eyes: negative scleral hemorrhage, negative anisocoria, negative icterus ENT: Negative Runny nose, negative gingival bleeding, Neck:  Negative scars, masses, torticollis, lymphadenopathy, JVD Lungs: Clear to auscultation bilaterally without wheezes or crackles Cardiovascular: Regular rate and rhythm without murmur gallop or rub normal S1 and S2 Abdomen: Positive abdominal pain around insertion site of drain, nondistended, positive soft, bowel sounds, no rebound, no ascites, no appreciable mass Extremities: No significant cyanosis, clubbing, or edema bilateral lower extremities Skin: Negative rashes, lesions, ulcers Psychiatric:  Negative depression, negative anxiety, negative fatigue, negative mania  Central nervous system:  Cranial nerves II through XII intact, tongue/uvula midline, all extremities muscle strength 5/5, sensation intact throughout, negative dysarthria, negative expressive aphasia, negative receptive aphasia.  .     Data Reviewed: Care during the described time interval was provided by me .  I have reviewed this patient's available data, including medical history, events of note, physical  examination, and all test results as part of my evaluation.  CBC: Recent Labs  Lab 10/21/19 0411 10/21/19 0411 10/22/19 0500 10/22/19 0500 10/23/19 0401 10/23/19 0401 10/23/19 1900 10/24/19 0440 10/25/19 0408 10/26/19 0421 10/27/19 0340  WBC 14.7*   < > 17.5*   < > 10.4  --   --  10.6* 9.6 9.8 8.8  NEUTROABS 12.5*  --  16.3*  --  8.1*  --   --   --   --  6.9 6.2  HGB 8.6*   < > 12.3   < > 8.4*   < > 9.3* 8.7* 8.6* 8.8* 8.7*  HCT 27.7*   < > 38.1   < > 27.9*   < > 30.5* 28.8* 28.9* 30.0* 29.2*  MCV 76.3*   < > 74.9*   < > 72.8*  --   --  69.9* 69.1* 70.4* 70.5*  PLT 773*   < > 427*   < >  565*  --   --  549* 785* 640* 667*   < > = values in this interval not displayed.   Basic Metabolic Panel: Recent Labs  Lab 10/21/19 0411 10/22/19 0500 10/23/19 0401 10/24/19 0440 10/25/19 0408 10/25/19 0940 10/26/19 0421 10/27/19 0340  NA 140   < > 139 131* 137  --  135 134*  K 3.5   < > 2.9* 3.3* 3.3*  --  3.6 3.8  CL 102   < > 100 94* 98  --  102 100  CO2 23   < > 28 25 28   --  26 27  GLUCOSE 85   < > 95 91 100*  --  102* 96  BUN 11   < > 7 <5* <5*  --  <5* <5*  CREATININE 0.59   < > 0.54 0.55 0.50  --  0.51 0.56  CALCIUM 8.2*   < > 7.8* 7.9* 8.2*  --  8.1* 8.2*  MG 2.0  --   --  1.6*  --  1.6* 2.0 1.8  PHOS  --   --   --   --   --  2.5 2.6 3.3   < > = values in this interval not displayed.   GFR: Estimated Creatinine Clearance: 80.8 mL/min (by C-G formula based on SCr of 0.56 mg/dL). Liver Function Tests: Recent Labs  Lab 10/22/19 0500 10/23/19 0401 10/24/19 0440 10/26/19 0421 10/27/19 0340  AST 24 19 20 24 19   ALT 27 9 9 11 9   ALKPHOS 105 36* 33* 34* 33*  BILITOT 0.7 0.7 0.8 0.4 0.3  PROT 7.1 6.7 6.6 7.3 7.4  ALBUMIN 2.5* 2.2* 2.2* 2.4* 2.3*   No results for input(s): LIPASE, AMYLASE in the last 168 hours. No results for input(s): AMMONIA in the last 168 hours. Coagulation Profile: No results for input(s): INR, PROTIME in the last 168 hours. Cardiac Enzymes: No  results for input(s): CKTOTAL, CKMB, CKMBINDEX, TROPONINI in the last 168 hours. BNP (last 3 results) No results for input(s): PROBNP in the last 8760 hours. HbA1C: No results for input(s): HGBA1C in the last 72 hours. CBG: Recent Labs  Lab 10/26/19 0756 10/26/19 1139 10/26/19 1603 10/26/19 2028 10/27/19 0730  GLUCAP 94 99 124* 102* 84   Lipid Profile: No results for input(s): CHOL, HDL, LDLCALC, TRIG, CHOLHDL, LDLDIRECT in the last 72 hours. Thyroid Function Tests: No results for input(s): TSH, T4TOTAL, FREET4, T3FREE, THYROIDAB in the last 72 hours. Anemia Panel: Recent Labs    10/26/19 0421 10/27/19 0340  FERRITIN 15 18   Sepsis Labs: No results for input(s): PROCALCITON, LATICACIDVEN in the last 168 hours.  Recent Results (from the past 240 hour(s))  SARS Coronavirus 2 by RT PCR (hospital order, performed in [facility] hospital lab) Nasopharyngeal Nasopharyngeal Swab     Status: Abnormal   Collection Time: 10/17/19  5:26 PM   Specimen: Nasopharyngeal Swab  Result Value Ref Range Status   SARS Coronavirus 2 POSITIVE (A) NEGATIVE Final    Comment: RESULT CALLED TO, READ BACK BY AND VERIFIED WITH: I.HODGES AT 1950 ON 10/17/19 BY N.THOMPSON (NOTE) SARS-CoV-2 target nucleic acids are DETECTED  SARS-CoV-2 RNA is generally detectable in upper respiratory specimens  during the acute phase of infection.  Positive results are indicative  of the presence of the identified virus, but do not rule out bacterial infection or co-infection with other pathogens not detected by the test.  Clinical correlation with patient history and  other diagnostic information  is necessary to determine patient infection status.  The expected result is negative.  Fact Sheet for Patients:   StrictlyIdeas.no   Fact Sheet for Healthcare Providers:   BankingDealers.co.za    This test is not yet approved or cleared by the [place] FDA and  has  been authorized for detection and/or diagnosis of SARS-CoV-2 by FDA under an Emergency Use Authorization (EUA).  This EUA will remain in effect (meaning t his test can be used) for the duration of  the COVID-19 declaration under Section 564(b)(1) of the Act, 21 U.S.C. section 360-bbb-3(b)(1), unless the authorization is terminated or revoked sooner.  Performed at [facility], [place] [address] [place], [place] [address]   Culture, blood (Routine X 2) w Reflex to ID Panel     Status: None   Collection Time: 10/18/19  5:30 AM   Specimen: BLOOD  Result Value Ref Range Status   Specimen Description   Final    BLOOD RIGHT ANTECUBITAL Performed at [facility] [address], [place], [place] [address]    Special Requests   Final    BOTTLES DRAWN AEROBIC AND ANAEROBIC Blood Culture adequate volume Performed at [facility] [address] [place], [place] [address]    Culture   Final    NO GROWTH 7 DAYS Performed at [facility], [place] [address] [place], [place] [address]    Report Status 10/25/2019 FINAL  Final  Culture, blood (Routine X 2) w Reflex to ID Panel     Status: None   Collection Time: 10/18/19  5:40 AM   Specimen: BLOOD RIGHT HAND  Result Value Ref Range Status   Specimen Description   Final    BLOOD RIGHT HAND Performed at [facility] [address], [place], [place] [address]    Special Requests   Final    BOTTLES DRAWN AEROBIC AND ANAEROBIC Blood Culture adequate volume Performed at [facility] [address], [place], [place] [address]    Culture   Final    NO GROWTH 7 DAYS Performed at [facility], [place] [address], [place], [place] [address]    Report Status 10/25/2019 FINAL  Final  Aerobic/Anaerobic Culture (surgical/deep wound)     Status: None   Collection Time: 10/21/19  3:11 PM   Specimen: Abscess  Result Value Ref Range Status   Specimen  Description   Final    ABSCESS Performed at [facility] [address], [place], [place] [address]    Special Requests   Final    Normal Performed at [facility], [place] [address], [place], [place] [address]    Gram Stain   Final    RARE WBC PRESENT, PREDOMINANTLY PMN NO ORGANISMS SEEN    Culture   Final    MODERATE STREPTOCOCCUS ANGINOSIS NO ANAEROBES ISOLATED Performed at [facility], [place] [address], [place], [place] [address]    Report Status 10/25/2019 FINAL  Final         Radiology Studies: CT ABDOMEN WO CONTRAST  Result Date: 10/26/2019 CLINICAL DATA:  Assess left upper quadrant drain EXAM: CT ABDOMEN WITHOUT CONTRAST TECHNIQUE: Multidetector CT imaging of the abdomen was performed following the standard protocol without IV contrast. COMPARISON:  10/20/2019 FINDINGS: Lower chest: Slight interval improvement in right basilar atelectasis and/or consolidation. No significant change in a large left pleural effusion and dense consolidation of the left lung base. Hepatobiliary:  No focal liver abnormality is seen. No gallstones, gallbladder wall thickening, or biliary dilatation. Pancreas: Unremarkable. No pancreatic ductal dilatation or surrounding inflammatory changes. Spleen: Normal in size without focal abnormality. Adrenals/Urinary Tract: Adrenal glands are unremarkable. Kidneys are normal, without renal calculi, focal lesion, or hydronephrosis. Stomach/Bowel: Status post Roux type gastric bypass. No evidence of bowel wall thickening, distention, or inflammatory changes. Vascular/Lymphatic: No significant vascular findings are present. No enlarged abdominal lymph nodes. Other: No abdominal wall hernia or abnormality. There has been interval placement of a percutaneous pigtail drainage catheter in the left upper quadrant, immediately underlying the left hemidiaphragm, within an air and fluid collection, which is significantly reduced in  volume compared to prior examination, now flattened and measuring approximately 4.8 x 4.2 x 1.6 cm (series 8, image 22, series 6, image 39). Musculoskeletal: No acute or significant osseous findings. IMPRESSION: 1. There has been interval placement of a percutaneous pigtail drainage catheter in the left upper quadrant, immediately underlying the left hemidiaphragm, within an air and fluid collection, which is significantly reduced in volume compared to prior examination. 2. Slight interval improvement in right basilar atelectasis and/or consolidation. No significant change in a large left pleural effusion and dense consolidation of the left lung base. 3. Status post Roux type gastric bypass. Electronically Signed   By: Eddie Candle M.D.   On: 10/26/2019 19:11        Scheduled Meds: . Chlorhexidine Gluconate Cloth  6 each Topical Daily  . feeding supplement (ENSURE ENLIVE)  237 mL Oral TID BM  . heparin injection (subcutaneous)  5,000 Units Subcutaneous Q8H  . insulin aspart  0-9 Units Subcutaneous TID WC  . lidocaine  1 patch Transdermal Q24H  . pantoprazole (PROTONIX) IV  40 mg Intravenous Q12H  . sodium chloride flush  5 mL Intracatheter Q8H  . sucralfate  1 g Oral TID WC & HS   Continuous Infusions: . sodium chloride    . piperacillin-tazobactam (ZOSYN)  IV 3.375 g (10/27/19 0833)     LOS: 9 days    Time spent:40 min    Tavie Haseman, Geraldo Docker, MD [facility] Pager [phone]  If 7PM-7AM, please contact night-coverage [email or web address] Password [facility] 10/27/2019, 8:34 AM

## 2019-10-28 DIAGNOSIS — F1010 Alcohol abuse, uncomplicated: Secondary | ICD-10-CM | POA: Diagnosis present

## 2019-10-28 LAB — COMPREHENSIVE METABOLIC PANEL
ALT: 9 U/L (ref 0–44)
AST: 18 U/L (ref 15–41)
Albumin: 2.5 g/dL — ABNORMAL LOW (ref 3.5–5.0)
Alkaline Phosphatase: 37 U/L — ABNORMAL LOW (ref 38–126)
Anion gap: 8 (ref 5–15)
BUN: <5 mg/dL — ABNORMAL LOW (ref 6–20)
CO2: 26 mmol/L (ref 22–32)
Calcium: 8.4 mg/dL — ABNORMAL LOW (ref 8.9–10.3)
Chloride: 102 mmol/L (ref 98–111)
Creatinine, Ser: 0.52 mg/dL (ref 0.44–1.00)
GFR calc Af Amer: >60 mL/min (ref >60)
GFR calc non Af Amer: >60 mL/min (ref >60)
Glucose, Bld: 92 mg/dL (ref 70–99)
Potassium: 3.7 mmol/L (ref 3.5–5.1)
Sodium: 136 mmol/L (ref 135–145)
Total Bilirubin: 0.3 mg/dL (ref 0.3–1.2)
Total Protein: 7.8 g/dL (ref 6.5–8.1)

## 2019-10-28 LAB — GLUCOSE, CAPILLARY
Glucose-Capillary: 94 mg/dL (ref 70–99)
Glucose-Capillary: 111 mg/dL — ABNORMAL HIGH (ref 70–99)
Glucose-Capillary: 108 mg/dL — ABNORMAL HIGH (ref 70–99)
Glucose-Capillary: 101 mg/dL — ABNORMAL HIGH (ref 70–99)

## 2019-10-28 LAB — CBC WITH DIFFERENTIAL/PLATELET
Abs Immature Granulocytes: 0.05 10*3/uL (ref 0.00–0.07)
Basophils Absolute: 0.1 10*3/uL (ref 0.0–0.1)
Basophils Relative: 1 %
Eosinophils Absolute: 0.1 10*3/uL (ref 0.0–0.5)
Eosinophils Relative: 1 %
HCT: 30.9 % — ABNORMAL LOW (ref 36.0–46.0)
Hemoglobin: 9 g/dL — ABNORMAL LOW (ref 12.0–15.0)
Immature Granulocytes: 1 %
Lymphocytes Relative: 21 %
Lymphs Abs: 2.1 10*3/uL (ref 0.7–4.0)
MCH: 20.8 pg — ABNORMAL LOW (ref 26.0–34.0)
MCHC: 29.1 g/dL — ABNORMAL LOW (ref 30.0–36.0)
MCV: 71.5 fL — ABNORMAL LOW (ref 80.0–100.0)
Monocytes Absolute: 0.6 10*3/uL (ref 0.1–1.0)
Monocytes Relative: 6 %
Neutro Abs: 7.2 10*3/uL (ref 1.7–7.7)
Neutrophils Relative %: 70 %
Platelets: 710 10*3/uL — ABNORMAL HIGH (ref 150–400)
RBC: 4.32 MIL/uL (ref 3.87–5.11)
RDW: 29.7 % — ABNORMAL HIGH (ref 11.5–15.5)
WBC: 10 10*3/uL (ref 4.0–10.5)
nRBC: 0 % (ref 0.0–0.2)

## 2019-10-28 LAB — C-REACTIVE PROTEIN: CRP: 5 mg/dL — ABNORMAL HIGH (ref <1.0)

## 2019-10-28 LAB — LACTATE DEHYDROGENASE: LDH: 152 U/L (ref 98–192)

## 2019-10-28 LAB — PHOSPHORUS: Phosphorus: 3.3 mg/dL (ref 2.5–4.6)

## 2019-10-28 LAB — FERRITIN: Ferritin: 21 ng/mL (ref 11–307)

## 2019-10-28 LAB — MAGNESIUM: Magnesium: 1.9 mg/dL (ref 1.7–2.4)

## 2019-10-28 LAB — D-DIMER, QUANTITATIVE: D-Dimer, Quant: 9.59 ug/mL-FEU — ABNORMAL HIGH (ref 0.00–0.50)

## 2019-10-28 MED ORDER — SODIUM CHLORIDE 0.9 % IV SOLN: INTRAVENOUS | Status: DC | PRN

## 2019-10-28 MED ORDER — METFORMIN HCL 500 MG PO TABS
500.0000 mg | ORAL_TABLET | Freq: Two times a day (BID) | ORAL | Status: DC
  Administered 2019-10-28 – 2019-10-29 (×2): 500 mg via ORAL
  Filled 2019-10-28 (×2): qty 1

## 2019-10-28 MED ORDER — THIAMINE HCL 100 MG PO TABS
100.0000 mg | ORAL_TABLET | Freq: Every day | ORAL | Status: DC
  Administered 2019-10-28 – 2019-10-29 (×2): 100 mg via ORAL
  Filled 2019-10-28 (×2): qty 1

## 2019-10-28 MED ORDER — FOLIC ACID 1 MG PO TABS
1.0000 mg | ORAL_TABLET | Freq: Every day | ORAL | Status: DC
  Administered 2019-10-28 – 2019-10-29 (×2): 1 mg via ORAL
  Filled 2019-10-28 (×2): qty 1

## 2019-10-28 NOTE — Progress Notes (Signed)
Subjective/Chief Complaint: Complains of new discomfort in lower central abd   Objective: Vital signs in last 24 hours: Temp:  [98.5 F (36.9 C)-98.7 F (37.1 C)] 98.5 F (36.9 C) (09/18 0634) Pulse Rate:  [72-87] 72 (09/18 0634) Resp:  [18-21] 21 (09/18 0634) BP: (119-125)/(81-87) 119/84 (09/18 0634) SpO2:  [100 %] 100 % (09/18 0634) Last BM Date: 10/27/19  Intake/Output from previous day: 09/17 0701 - 09/18 0700 In: 680 [P.O.:480; IV Piggyback:200] Out: 20 [Drains:20] Intake/Output this shift: No intake/output data recorded.  General appearance: alert and cooperative Resp: clear to auscultation bilaterally Cardio: regular rate and rhythm GI: soft, mild tenderness suprapubic area. drain output minimal  Lab Results:  Recent Labs    10/27/19 0340 10/28/19 0512  WBC 8.8 10.0  HGB 8.7* 9.0*  HCT 29.2* 30.9*  PLT 667* 710*   BMET Recent Labs    10/27/19 0340 10/28/19 0512  NA 134* 136  K 3.8 3.7  CL 100 102  CO2 27 26  GLUCOSE 96 92  BUN <5* <5*  CREATININE 0.56 0.52  CALCIUM 8.2* 8.4*   PT/INR No results for input(s): LABPROT, INR in the last 72 hours. ABG No results for input(s): PHART, HCO3 in the last 72 hours.  Invalid input(s): PCO2, PO2  Studies/Results: CT ABDOMEN WO CONTRAST  Result Date: 10/26/2019 CLINICAL DATA:  Assess left upper quadrant drain EXAM: CT ABDOMEN WITHOUT CONTRAST TECHNIQUE: Multidetector CT imaging of the abdomen was performed following the standard protocol without IV contrast. COMPARISON:  10/20/2019 FINDINGS: Lower chest: Slight interval improvement in right basilar atelectasis and/or consolidation. No significant change in a large left pleural effusion and dense consolidation of the left lung base. Hepatobiliary: No focal liver abnormality is seen. No gallstones, gallbladder wall thickening, or biliary dilatation. Pancreas: Unremarkable. No pancreatic ductal dilatation or surrounding inflammatory changes. Spleen: Normal in  size without focal abnormality. Adrenals/Urinary Tract: Adrenal glands are unremarkable. Kidneys are normal, without renal calculi, focal lesion, or hydronephrosis. Stomach/Bowel: Status post Roux type gastric bypass. No evidence of bowel wall thickening, distention, or inflammatory changes. Vascular/Lymphatic: No significant vascular findings are present. No enlarged abdominal lymph nodes. Other: No abdominal wall hernia or abnormality. There has been interval placement of a percutaneous pigtail drainage catheter in the left upper quadrant, immediately underlying the left hemidiaphragm, within an air and fluid collection, which is significantly reduced in volume compared to prior examination, now flattened and measuring approximately 4.8 x 4.2 x 1.6 cm (series 8, image 22, series 6, image 39). Musculoskeletal: No acute or significant osseous findings. IMPRESSION: 1. There has been interval placement of a percutaneous pigtail drainage catheter in the left upper quadrant, immediately underlying the left hemidiaphragm, within an air and fluid collection, which is significantly reduced in volume compared to prior examination. 2. Slight interval improvement in right basilar atelectasis and/or consolidation. No significant change in a large left pleural effusion and dense consolidation of the left lung base. 3. Status post Roux type gastric bypass. Electronically Signed   By: Eddie Candle M.D.   On: 10/26/2019 19:11    Anti-infectives: Anti-infectives (From admission, onward)   Start     Dose/Rate Route Frequency Ordered Stop   10/27/19 1600  Ampicillin-Sulbactam (UNASYN) 3 g in sodium chloride 0.9 % 100 mL IVPB        3 g 200 mL/hr over 30 Minutes Intravenous Every 6 hours 10/27/19 0950     10/19/19 1000  remdesivir 100 mg in sodium chloride 0.9 % 100 mL IVPB       "  Followed by" Linked Group Details   100 mg 200 mL/hr over 30 Minutes Intravenous Daily 10/18/19 0515 10/22/19 1042   10/18/19 0600  remdesivir  200 mg in sodium chloride 0.9% 250 mL IVPB       "Followed by" Linked Group Details   200 mg 580 mL/hr over 30 Minutes Intravenous Once 10/18/19 0515 10/18/19 0633   10/18/19 0600  piperacillin-tazobactam (ZOSYN) IVPB 3.375 g  Status:  Discontinued        3.375 g 12.5 mL/hr over 240 Minutes Intravenous Every 8 hours 10/18/19 0520 10/27/19 0950   10/17/19 2330  piperacillin-tazobactam (ZOSYN) IVPB 3.375 g        3.375 g 100 mL/hr over 30 Minutes Intravenous  Once 10/17/19 2329 10/18/19 0125      Assessment/Plan: s/p * No surgery found * Advance diet. Continue soft foods Continue abx and drain.  If pain does not worsen then she may be able to go home this weekend  LOS: 10 days    Autumn Messing III 10/28/2019

## 2019-10-28 NOTE — Progress Notes (Signed)
PROGRESS NOTE    Kendra [name]  QMV:[id number] DOB: 10/14/1975 DOA: 10/17/2019 PCP: Merryl Hacker, No     Brief Narrative:  Kendra Curtisis a 44 year old BF PMHx  gastric bypass 2009,    who presented to the ED on 10/17/2019 with progressive abdominal pain and Covid-like symptoms for 2 weeks. Patient was noted to be Covid-19 positive with melanotic stool with a hemoglobin 8.2. CT abdomen/pelvis notable for multiple areas of free air and organized collection posterior to the uterus consistent with perforated viscus. Patient was started on IV PPI, IV antibiotics with general surgery consultation. Patient was admitted to the hospital service.  In regards to Covid-19 viral infection, patient not very symptomatic. She is on room air and complete a 5-day course of remdesivir.  Patient has been followed by general surgery in terms of her perforated viscus. Patient underwent IR guided drain placement on 10/21/2019. Continues on full liquid diet, IV PPI twice daily and Carafate. Pain slightly worse this morning, Dilaudid dose increased and started on oral pain medications.   Subjective: 9/18 afebrile overnight A/O x4, negative S OB, negative CP, negative nausea.  States she sat in chair for couple hours today felt pretty well.   Assessment & Plan: Covid vaccination; unvaccinated   Principal Problem:   Perforated viscus Active Problems:   History of gastric bypass   SIRS (systemic inflammatory response syndrome) (HCC)   Pneumonia due to COVID-19 virus   Anemia   AKI (acute kidney injury) ([place])   Hyperglycemia   ETOH abuse   Suspected perforated bleeding anastomotic ulcer/LUQ gastric abscess  Patient presenting to the ED with progressive abdominal discomfort over the past 2 weeks. History of gastric bypass 2009. CT abdomen/pelvis with multiple foci free air throughout abdomen consistent with perforated viscus, site of perforation not definitively noted but in the setting of gastric  bypass, perforated marginal ulcer is considered. --General surgery following, appreciate assistance --s/p IR LUQ drain placed 9/11; output38mL past 24h --Diet advanced to soft diet by general surgery today --Protonix 40 mg IV every 12 hours --Carafate 1 g p.o. TID/HS --Zosyn 3.375 g IV every 8 hours (Start: 9/7) --Oxycodone 5 mg p.o. every 4 hours prn moderate pain --Pain control with Dilaudid 1 mg IV q2h prn severe breakthrough pain --Continue monitor drain output closely --Surgery plans repeat imaging in 1-2 days -9/16 IR will schedule repeat CT scan abdomen; see results below -9/17 per surgery note- s/p IR drain placement 9/11. Cx w/ Strep Anginosis. Cont abx   Keep drain in place given change in character of the fluid. Cont PPI twice daily and Carafate 4 times daily  - Possible d/c tomorrow from our standpoint with drain teaching and follow up with one of our bariatric surgeons in the office in ~1 week.   Zosyn 9/8 - 9/17. Unasyn 9/17 >>   9/17 per IR note;  -Maintain drain, cont irrigation with saline; if pt d/c'd home over weekend rec once daily flush of drain with 5 cc sterile NS, output recording and dressing change every 1-2 days;   -She will be scheduled for drain injection in IR clinic in 2 weeks; -9/18 per surgery note today not yet ready to discharge patient.  Patient looks extremely good expect that they would discharge her in the next 24 to 48 hours  Covid-19 viral infection COVID-19 Labs  Recent Labs    10/26/19 0421 10/27/19 0340 10/28/19 0512  DDIMER 12.31* 9.76* 9.59*  FERRITIN 15 18 21   LDH 178 150 152  CRP 8.7*  6.2* 5.0*    Lab Results  Component Value Date   SARSCOV2NAA POSITIVE (A) 10/17/2019  Patient presented to the ED with progressive shortness of breath, weakness, fatigue and loss of appetite. She was found to be Covid-19 positive on 10/17/2019. CT notable for groundglass opacities bilateral lung fields consistent with viral Covid-19 infection.  Patient is not hypoxic; with good oxygenation on room air. --Completed 5-day course of Remdesivir on 10/22/2019 --Not a candidate for baricitinib or steroids as she is oxygenating well on room air --Albuterol MDI as needed for shortness of breath/wheezing.  Acute blood loss anemia Hemoglobin dropped to 6.2 on 10/18/2019, transfused 2 unit PRBC. Lab Results  Component Value Date   HGB 9.0 (L) 10/28/2019   HGB 8.7 (L) 10/27/2019   HGB 8.8 (L) 10/26/2019   HGB 8.6 (L) 10/25/2019   HGB 8.7 (L) 10/24/2019   --Follow CBC daily --Transfuse for hemoglobin less than 7.0 or active bleeding  Acute renal failure: Resolved Etiology likely secondary to prerenal with poor oral intake versus ATN from acute blood loss anemia as above. Was started on IV fluids and transfused with 2 units PRBCs. Creatinine now normalized. --Closely monitor renal function daily Lab Results  Component Value Date   CREATININE 0.52 10/28/2019   CREATININE 0.56 10/27/2019   CREATININE 0.51 10/26/2019   CREATININE 0.50 10/25/2019   CREATININE 0.55 10/24/2019   Type 2 diabetes mellitus uncontrolled with complication* -4/74 Hemoglobin A1c 8.9. No previous history of diabetes. --Continue insulin sliding scale for coverage --CBGs qAC/HS -9/18 Metformin 500 mg BID  Hypokalemia -Potassium goal> 4  Hypomagnesmia -Magnesium goal> 2  EtOH abuse -Patient admitted to significant drinking problem.  Stated would drink 1/5 of liquor in a day.  Starting in the morning -9/17 thiamine 100 mg daily -2/59 folic acid 1 mg daily   DVT prophylaxis: Heparin subcu Code Status: Full Family Communication: 9/18 spoke with Al Pimple (mother) counseled on plan of care answered all questions Status is: Inpatient    Dispo: The patient is from: Home  Anticipated d/c is to: Home  Anticipated d/c date is:??  Patient currently unstable      Consultants:   [facility] IR   Procedures/Significant Events:  9/10 CT ABDOMEN WITHOUT CONTRAST;Increased organization of fluid collection beneath the LEFT hemidiaphragm is concerning for abscess formation. 2. No evidence active leaking of the water-soluble oral contrast at the gastrojejunostomy or elsewhere proximal GI tract. 3. Moderate volume intraperitoneal free air. Increased free air beneath the RIGHT hemidiaphragm. 4. Dense bibasilar consolidation with increasing atelectasis and effusion. 9/11 IR placed LUQ drain s/p abscess by Dr. Laurence Ferrari;Successful placement of a 12 French drainage catheter into the left upper quadrant fluid and gas collection. The aspirated material appears like gastric secretions. A sample was sent for Gram stain and culture. 9/16 CT ABDOMEN WITHOUT CONTRAST;There has been interval placement of a percutaneous pigtail drainage catheter in the left upper quadrant, immediately underlyingthe left hemidiaphragm, within an air and fluid collection, which is significantly reduced in volume compared to prior examination. -Slight interval improvement in right basilar atelectasis and/or consolidation. No significant change in a large left pleural effusion and dense consolidation of the left lung base. -Status post Roux type gastric bypass.  I have personally reviewed and interpreted all radiology studies and my findings are as above.  VENTILATOR SETTINGS: Room air 9/18 SPO2; 100%  Cultures   Antimicrobials: Anti-infectives (From admission, onward)   Start     Ordered Stop   10/19/19 1000  remdesivir 100 mg  in sodium chloride 0.9 % 100 mL IVPB       "Followed by" Linked Group Details   10/18/19 0515 10/22/19 1042   10/18/19 0600  remdesivir 200 mg in sodium chloride 0.9% 250 mL IVPB       "Followed by" Linked Group Details   10/18/19 0515 10/18/19 0633   10/18/19 0600  piperacillin-tazobactam (ZOSYN) IVPB 3.375 g        10/18/19 0520     10/17/19 2330  piperacillin-tazobactam  (ZOSYN) IVPB 3.375 g        10/17/19 2329 10/18/19 0125       Devices LUQ abdominal JP drain 9/11>>   LINES / TUBES:      Continuous Infusions:  sodium chloride     ampicillin-sulbactam (UNASYN) IV 3 g (10/28/19 0954)     Objective: Vitals:   10/27/19 0415 10/27/19 1318 10/27/19 2006 10/28/19 0634  BP: 126/85 119/81 125/87 119/84  Pulse: 70 75 87 72  Resp: 17 18 19  (!) 21  Temp: 98.2 F (36.8 C) 98.7 F (37.1 C) 98.7 F (37.1 C) 98.5 F (36.9 C)  TempSrc:  Oral Oral   SpO2: 100% 100% 100% 100%  Weight:      Height:        Intake/Output Summary (Last 24 hours) at 10/28/2019 1211 Last data filed at 10/28/2019 1102 Gross per 24 hour  Intake 680 ml  Output 20 ml  Net 660 ml   Filed Weights   10/21/19 1522  Weight: 63.3 kg    Examination:  General: A/O x4, No acute respiratory distress, cachectic Eyes: negative scleral hemorrhage, negative anisocoria, negative icterus ENT: Negative Runny nose, negative gingival bleeding, Neck:  Negative scars, masses, torticollis, lymphadenopathy, JVD Lungs: Clear to auscultation bilaterally without wheezes or crackles Cardiovascular: Regular rate and rhythm without murmur gallop or rub normal S1 and S2 Abdomen: Positive abdominal pain around insertion site of drain, nondistended, positive soft, bowel sounds, no rebound, no ascites, no appreciable mass Extremities: No significant cyanosis, clubbing, or edema bilateral lower extremities Skin: Negative rashes, lesions, ulcers Psychiatric:  Negative depression, negative anxiety, negative fatigue, negative mania  Central nervous system:  Cranial nerves II through XII intact, tongue/uvula midline, all extremities muscle strength 5/5, sensation intact throughout, negative dysarthria, negative expressive aphasia, negative receptive aphasia.  .     Data Reviewed: Care during the described time interval was provided by me .  I have reviewed this patient's available data, including  medical history, events of note, physical examination, and all test results as part of my evaluation.  CBC: Recent Labs  Lab 10/22/19 0500 10/22/19 0500 10/23/19 0401 10/23/19 1900 10/24/19 0440 10/25/19 0408 10/26/19 0421 10/27/19 0340 10/28/19 0512  WBC 17.5*   < > 10.4  --  10.6* 9.6 9.8 8.8 10.0  NEUTROABS 16.3*  --  8.1*  --   --   --  6.9 6.2 7.2  HGB 12.3   < > 8.4*   < > 8.7* 8.6* 8.8* 8.7* 9.0*  HCT 38.1   < > 27.9*   < > 28.8* 28.9* 30.0* 29.2* 30.9*  MCV 74.9*   < > 72.8*  --  69.9* 69.1* 70.4* 70.5* 71.5*  PLT 427*   < > 565*  --  549* 785* 640* 667* 710*   < > = values in this interval not displayed.   Basic Metabolic Panel: Recent Labs  Lab 10/24/19 0440 10/25/19 0408 10/25/19 0940 10/26/19 0421 10/27/19 0340 10/28/19 0512  NA 131* 137  --  135 134* 136  K 3.3* 3.3*  --  3.6 3.8 3.7  CL 94* 98  --  102 100 102  CO2 25 28  --  26 27 26   GLUCOSE 91 100*  --  102* 96 92  BUN <5* <5*  --  <5* <5* <5*  CREATININE 0.55 0.50  --  0.51 0.56 0.52  CALCIUM 7.9* 8.2*  --  8.1* 8.2* 8.4*  MG 1.6*  --  1.6* 2.0 1.8 1.9  PHOS  --   --  2.5 2.6 3.3 3.3   GFR: Estimated Creatinine Clearance: 80.8 mL/min (by C-G formula based on SCr of 0.52 mg/dL). Liver Function Tests: Recent Labs  Lab 10/23/19 0401 10/24/19 0440 10/26/19 0421 10/27/19 0340 10/28/19 0512  AST 19 20 24 19 18   ALT 9 9 11 9 9   ALKPHOS 36* 33* 34* 33* 37*  BILITOT 0.7 0.8 0.4 0.3 0.3  PROT 6.7 6.6 7.3 7.4 7.8  ALBUMIN 2.2* 2.2* 2.4* 2.3* 2.5*   No results for input(s): LIPASE, AMYLASE in the last 168 hours. No results for input(s): AMMONIA in the last 168 hours. Coagulation Profile: No results for input(s): INR, PROTIME in the last 168 hours. Cardiac Enzymes: No results for input(s): CKTOTAL, CKMB, CKMBINDEX, TROPONINI in the last 168 hours. BNP (last 3 results) No results for input(s): PROBNP in the last 8760 hours. HbA1C: No results for input(s): HGBA1C in the last 72  hours. CBG: Recent Labs  Lab 10/27/19 1150 10/27/19 1639 10/27/19 2050 10/28/19 0800 10/28/19 1134  GLUCAP 90 88 94 94 111*   Lipid Profile: No results for input(s): CHOL, HDL, LDLCALC, TRIG, CHOLHDL, LDLDIRECT in the last 72 hours. Thyroid Function Tests: No results for input(s): TSH, T4TOTAL, FREET4, T3FREE, THYROIDAB in the last 72 hours. Anemia Panel: Recent Labs    10/27/19 0340 10/28/19 0512  FERRITIN 18 21   Sepsis Labs: No results for input(s): PROCALCITON, LATICACIDVEN in the last 168 hours.  Recent Results (from the past 240 hour(s))  Aerobic/Anaerobic Culture (surgical/deep wound)     Status: None   Collection Time: 10/21/19  3:11 PM   Specimen: Abscess  Result Value Ref Range Status   Specimen Description   Final    ABSCESS Performed at [facility] [address], [place], [place] [address]    Special Requests   Final    Normal Performed at [facility], [place] [address], [place], [place] [address]    Gram Stain   Final    RARE WBC PRESENT, PREDOMINANTLY PMN NO ORGANISMS SEEN    Culture   Final    MODERATE STREPTOCOCCUS ANGINOSIS NO ANAEROBES ISOLATED Performed at [facility], [place] [address], [place], [place] [address]    Report Status 10/25/2019 FINAL  Final         Radiology Studies: CT ABDOMEN WO CONTRAST  Result Date: 10/26/2019 CLINICAL DATA:  Assess left upper quadrant drain EXAM: CT ABDOMEN WITHOUT CONTRAST TECHNIQUE: Multidetector CT imaging of the abdomen was performed following the standard protocol without IV contrast. COMPARISON:  10/20/2019 FINDINGS: Lower chest: Slight interval improvement in right basilar atelectasis and/or consolidation. No significant change in a large left pleural effusion and dense consolidation of the left lung base. Hepatobiliary: No focal liver abnormality is seen. No gallstones, gallbladder wall thickening, or biliary dilatation. Pancreas: Unremarkable. No  pancreatic ductal dilatation or surrounding inflammatory changes. Spleen: Normal in size without focal abnormality. Adrenals/Urinary Tract: Adrenal glands are unremarkable. Kidneys are normal,  without renal calculi, focal lesion, or hydronephrosis. Stomach/Bowel: Status post Roux type gastric bypass. No evidence of bowel wall thickening, distention, or inflammatory changes. Vascular/Lymphatic: No significant vascular findings are present. No enlarged abdominal lymph nodes. Other: No abdominal wall hernia or abnormality. There has been interval placement of a percutaneous pigtail drainage catheter in the left upper quadrant, immediately underlying the left hemidiaphragm, within an air and fluid collection, which is significantly reduced in volume compared to prior examination, now flattened and measuring approximately 4.8 x 4.2 x 1.6 cm (series 8, image 22, series 6, image 39). Musculoskeletal: No acute or significant osseous findings. IMPRESSION: 1. There has been interval placement of a percutaneous pigtail drainage catheter in the left upper quadrant, immediately underlying the left hemidiaphragm, within an air and fluid collection, which is significantly reduced in volume compared to prior examination. 2. Slight interval improvement in right basilar atelectasis and/or consolidation. No significant change in a large left pleural effusion and dense consolidation of the left lung base. 3. Status post Roux type gastric bypass. Electronically Signed   By: Eddie Candle M.D.   On: 10/26/2019 19:11        Scheduled Meds:  Chlorhexidine Gluconate Cloth  6 each Topical Daily   feeding supplement (ENSURE ENLIVE)  237 mL Oral TID BM   folic acid  1 mg Oral Daily   heparin injection (subcutaneous)  5,000 Units Subcutaneous Q8H   insulin aspart  0-9 Units Subcutaneous TID WC   lidocaine  1 patch Transdermal Q24H   pantoprazole (PROTONIX) IV  40 mg Intravenous Q12H   sodium chloride flush  5 mL  Intracatheter Q8H   sucralfate  1 g Oral TID WC & HS   thiamine  100 mg Oral Daily   Continuous Infusions:  sodium chloride     ampicillin-sulbactam (UNASYN) IV 3 g (10/28/19 0954)     LOS: 10 days    Time spent:40 min    Shivaan Tierno, Geraldo Docker, MD [facility] Pager [phone]  If 7PM-7AM, please contact night-coverage [email or web address] Password [facility] 10/28/2019, 12:11 PM

## 2019-10-29 LAB — CBC WITH DIFFERENTIAL/PLATELET
Abs Immature Granulocytes: 0.05 10*3/uL (ref 0.00–0.07)
Basophils Absolute: 0.1 10*3/uL (ref 0.0–0.1)
Basophils Relative: 1 %
Eosinophils Absolute: 0.1 10*3/uL (ref 0.0–0.5)
Eosinophils Relative: 2 %
HCT: 29.7 % — ABNORMAL LOW (ref 36.0–46.0)
Hemoglobin: 8.8 g/dL — ABNORMAL LOW (ref 12.0–15.0)
Immature Granulocytes: 1 %
Lymphocytes Relative: 33 %
Lymphs Abs: 2.7 10*3/uL (ref 0.7–4.0)
MCH: 21.2 pg — ABNORMAL LOW (ref 26.0–34.0)
MCHC: 29.6 g/dL — ABNORMAL LOW (ref 30.0–36.0)
MCV: 71.4 fL — ABNORMAL LOW (ref 80.0–100.0)
Monocytes Absolute: 0.5 10*3/uL (ref 0.1–1.0)
Monocytes Relative: 7 %
Neutro Abs: 4.6 10*3/uL (ref 1.7–7.7)
Neutrophils Relative %: 56 %
Platelets: 653 10*3/uL — ABNORMAL HIGH (ref 150–400)
RBC: 4.16 MIL/uL (ref 3.87–5.11)
RDW: 30.1 % — ABNORMAL HIGH (ref 11.5–15.5)
WBC: 8 10*3/uL (ref 4.0–10.5)
nRBC: 0 % (ref 0.0–0.2)

## 2019-10-29 LAB — COMPREHENSIVE METABOLIC PANEL
ALT: 9 U/L (ref 0–44)
AST: 20 U/L (ref 15–41)
Albumin: 2.4 g/dL — ABNORMAL LOW (ref 3.5–5.0)
Alkaline Phosphatase: 36 U/L — ABNORMAL LOW (ref 38–126)
Anion gap: 9 (ref 5–15)
BUN: <5 mg/dL — ABNORMAL LOW (ref 6–20)
CO2: 27 mmol/L (ref 22–32)
Calcium: 8.6 mg/dL — ABNORMAL LOW (ref 8.9–10.3)
Chloride: 102 mmol/L (ref 98–111)
Creatinine, Ser: 0.59 mg/dL (ref 0.44–1.00)
GFR calc Af Amer: >60 mL/min (ref >60)
GFR calc non Af Amer: >60 mL/min (ref >60)
Glucose, Bld: 89 mg/dL (ref 70–99)
Potassium: 3.6 mmol/L (ref 3.5–5.1)
Sodium: 138 mmol/L (ref 135–145)
Total Bilirubin: 0.3 mg/dL (ref 0.3–1.2)
Total Protein: 7.3 g/dL (ref 6.5–8.1)

## 2019-10-29 LAB — D-DIMER, QUANTITATIVE: D-Dimer, Quant: 7.43 ug/mL-FEU — ABNORMAL HIGH (ref 0.00–0.50)

## 2019-10-29 LAB — MAGNESIUM: Magnesium: 1.7 mg/dL (ref 1.7–2.4)

## 2019-10-29 LAB — FERRITIN: Ferritin: 20 ng/mL (ref 11–307)

## 2019-10-29 LAB — LACTATE DEHYDROGENASE: LDH: 142 U/L (ref 98–192)

## 2019-10-29 LAB — GLUCOSE, CAPILLARY
Glucose-Capillary: 88 mg/dL (ref 70–99)
Glucose-Capillary: 95 mg/dL (ref 70–99)

## 2019-10-29 LAB — PHOSPHORUS: Phosphorus: 3.2 mg/dL (ref 2.5–4.6)

## 2019-10-29 LAB — C-REACTIVE PROTEIN: CRP: 4.4 mg/dL — ABNORMAL HIGH (ref <1.0)

## 2019-10-29 MED ORDER — LIDOCAINE 5 % EX PTCH: 1.0000 | MEDICATED_PATCH | CUTANEOUS | 0 refills | Status: AC

## 2019-10-29 MED ORDER — SUCRALFATE 1 GM/10ML PO SUSP: 1.0000 g | Freq: Three times a day (TID) | ORAL | 0 refills | Status: AC

## 2019-10-29 MED ORDER — ALUM & MAG HYDROXIDE-SIMETH 200-200-20 MG/5ML PO SUSP: 30.0000 mL | ORAL | 0 refills | Status: AC | PRN

## 2019-10-29 MED ORDER — ALBUTEROL SULFATE HFA 108 (90 BASE) MCG/ACT IN AERS: 2.0000 | INHALATION_SPRAY | Freq: Four times a day (QID) | RESPIRATORY_TRACT | 0 refills | Status: AC | PRN

## 2019-10-29 MED ORDER — MELATONIN 3 MG PO TABS: 6.0000 mg | ORAL_TABLET | Freq: Every evening | ORAL | 0 refills | Status: AC | PRN

## 2019-10-29 MED ORDER — METFORMIN HCL 500 MG PO TABS: 500.0000 mg | ORAL_TABLET | Freq: Two times a day (BID) | ORAL | 0 refills | Status: AC

## 2019-10-29 MED ORDER — AMOXICILLIN-POT CLAVULANATE 875-125 MG PO TABS: 1.0000 | ORAL_TABLET | Freq: Two times a day (BID) | ORAL | 0 refills | Status: AC

## 2019-10-29 MED ORDER — ONDANSETRON HCL 4 MG PO TABS: 4.0000 mg | ORAL_TABLET | Freq: Three times a day (TID) | ORAL | 0 refills | Status: AC | PRN

## 2019-10-29 MED ORDER — PANTOPRAZOLE SODIUM 40 MG PO TBEC: 40.0000 mg | DELAYED_RELEASE_TABLET | Freq: Two times a day (BID) | ORAL | 0 refills | Status: AC

## 2019-10-29 MED ORDER — ACETAMINOPHEN 325 MG PO TABS: 650.0000 mg | ORAL_TABLET | Freq: Four times a day (QID) | ORAL | 0 refills | Status: AC | PRN

## 2019-10-29 MED ORDER — OXYCODONE-ACETAMINOPHEN 5-325 MG PO TABS: 1.0000 | ORAL_TABLET | ORAL | 0 refills | Status: AC | PRN

## 2019-10-29 MED ORDER — THIAMINE HCL 100 MG PO TABS: 100.0000 mg | ORAL_TABLET | Freq: Every day | ORAL | 0 refills | Status: AC

## 2019-10-29 MED ORDER — AMOXICILLIN-POT CLAVULANATE 875-125 MG PO TABS
1.0000 | ORAL_TABLET | Freq: Two times a day (BID) | ORAL | Status: DC
  Administered 2019-10-29: 1 via ORAL
  Filled 2019-10-29: qty 1

## 2019-10-29 MED ORDER — FOLIC ACID 1 MG PO TABS: 1.0000 mg | ORAL_TABLET | Freq: Every day | ORAL | 0 refills | Status: AC

## 2019-10-29 NOTE — Discharge Summary (Signed)
Physician Discharge Summary  Kendra [name] YDX:[id number] DOB: 11/10/1975 DOA: 10/17/2019  PCP: Pcp, No  Admit date: 10/17/2019 Discharge date: 10/29/2019  Time spent: 35 minutes  Recommendations for Outpatient Follow-up:  Covid vaccination; unvaccinated  Suspected perforated bleeding anastomotic ulcer/LUQ gastric abscess  Patient presenting to the ED with progressive abdominal discomfort over the past 2 weeks. History of gastric bypass 2009. CT abdomen/pelvis with multiple foci free air throughout abdomen consistent with perforated viscus, site of perforation not definitively noted but in the setting of gastric bypass, perforated marginal ulcer is considered. --General surgery following,  --s/p IR LUQ drain placed 9/11; output38mL past 24h --Protonix 40 mg IV every 12 hours --Carafate 1 g p.o. TID/HS --Zosyn 3.375 g IV every 8 hours (Start: 9/7) --Oxycodone 5 mg p.o. every 4 hours prn moderate pain --Pain control with Dilaudid 1 mg IV q2h prn severe breakthrough pain --Continue monitor drain output closely -9/16 IR will schedule repeat CT scan abdomen; see results below -9/17 per surgery note- s/p IR drain placement 9/11. Cx w/ Strep Anginosis. Cont abx              Keep drain in placegiven change in character of the fluid. ContPPI twice daily andCarafate 4 times daily             -.  Follow-up with Dr. Loma Messing surgery 9/29@ 0 920              -Zosyn 9/8- 9/17. Unasyn 9/17 >> 9/19 Augmentin x5 days per surgery -9 /17 per IR note;             -Maintain drain, cont irrigation with saline; if pt d/c'd home over weekend rec once daily flush of drain with 5 cc sterile NS, output recording and dressing change every 1-2 days;              -She will be scheduled for drain injection in IR clinic in 2 weeks;  Covid-19 viral infection COVID-19 Labs  Recent Labs    10/27/19 0340 10/28/19 0512 10/29/19 0439  DDIMER 9.76* 9.59* 7.43*  FERRITIN 18 21 20   LDH 720  152 142  CRP 6.2* 5.0* 4.4*    Lab Results  Component Value Date   SARSCOV2NAA POSITIVE (A) 10/17/2019   Patient presented to the ED with progressive shortness of breath, weakness, fatigue and loss of appetite. She was found to be Covid-19 positive on 10/17/2019. CT notable for groundglass opacities bilateral lung fields consistent with viral Covid-19 infection. Patient is not hypoxic; with good oxygenation on room air. --Completed 5-day course of Remdesivir on 10/22/2019 --Not a candidate for baricitinib or steroids as she is oxygenating well on room air --Albuterol MDI as needed for shortness of breath/wheezing. -Patient will be considered contagious until 11/07/2019 follow below recommendations to protect herself and family members;  Acute blood loss anemia Hemoglobin dropped to 6.2 on 10/18/2019, transfused 2 unit PRBC. Lab Results  Component Value Date   HGB 8.8 (L) 10/29/2019   HGB 9.0 (L) 10/28/2019   HGB 8.7 (L) 10/27/2019   HGB 8.8 (L) 10/26/2019   HGB 8.6 (L) 10/25/2019  -Stable  Acute renal failure: Resolved Etiology likely secondary to prerenal with poor oral intake versus ATN from acute blood loss anemia as above. Was started on IV fluids and transfused with 2 units PRBCs. Creatinine now normalized. --Closely monitor renal function daily Lab Results  Component Value Date   CREATININE 0.59 10/29/2019   CREATININE 0.52 10/28/2019   CREATININE  0.56 10/27/2019   CREATININE 0.51 10/26/2019   CREATININE 0.50 10/25/2019  -Stable  Type 2 diabetes mellitusuncontrolled with complication -9/83 Hemoglobin A1c 8.9. No previous history of diabetes. -9/18 Metformin 500 mg BID -Will need to establish care with PCP titrate medication to control her diabetes.  Hypokalemia -Potassium goal>4  Hypomagnesmia -Magnesium goal>2  EtOH abuse -Patient admitted to significant drinking problem.  Stated would drink 1/5 of liquor in a day.  Starting in the morning -9/17  thiamine 100 mg daily -3/82 folic acid 1 mg daily   Discharge Diagnoses:  Principal Problem:   Perforated viscus Active Problems:   History of gastric bypass   SIRS (systemic inflammatory response syndrome) (HCC)   Pneumonia due to COVID-19 virus   Anemia   AKI (acute kidney injury) ([place])   Hyperglycemia   ETOH abuse   Discharge Condition: Stable  Diet recommendation: Soft  Filed Weights   10/21/19 1522  Weight: 63.3 kg    History of present illness:  Kendra Curtisis a 13 year oldBF PMHxgastric bypass 2009,   who presented to the ED on 10/17/2019 with progressive abdominal pain and Covid-like symptoms for 2 weeks. Patient was noted to be Covid-19 positive with melanotic stool with a hemoglobin 8.2. CT abdomen/pelvis notable for multiple areas of free air and organized collection posterior to the uterus consistent with perforated viscus. Patient was started on IV PPI, IV antibiotics with general surgery consultation. Patient was admitted to the hospital service.  In regards to Covid-19 viral infection, patient not very symptomatic. She is on room air and complete a 5-day course of remdesivir.  Patient has been followed by general surgery in terms of her perforated viscus. Patient underwent IR guided drain placement on 10/21/2019. Continues on full liquid diet, IV PPI twice daily and Carafate. Pain slightly worse this morning, Dilaudid dose increased and started on oral pain medications.  Hospital Course:  See above  Procedures: 9/10 CT ABDOMEN WITHOUT CONTRAST;Increased organization of fluid collection beneath the LEFT hemidiaphragm is concerning for abscess formation. 2. No evidence active leaking of the water-soluble oral contrast at the gastrojejunostomy or elsewhere proximal GI tract. 3. Moderate volume intraperitoneal free air. Increased free air beneath the RIGHT hemidiaphragm. 4. Dense bibasilar consolidation with increasing atelectasis  and effusion. 9/11 IR placed LUQ drain s/p abscess by Dr. Laurence Ferrari;Successful placement of a 12 French drainage catheter into the left upper quadrant fluid and gas collection. The aspirated material appears like gastric secretions. A sample was sent for Gram stain and culture. 9/16 CT ABDOMEN WITHOUT CONTRAST;There has been interval placement of a percutaneous pigtail drainage catheter in the left upper quadrant, immediately underlyingthe left hemidiaphragm, within an air and fluid collection, which is significantly reduced in volume compared to prior examination. -Slight interval improvement in right basilar atelectasis and/or consolidation. No significant change in a large left pleural effusion and dense consolidation of the left lung base. -Status post Roux type gastric bypass.  Consultations: [facility] IR  Cultures   9/7 SARS coronavirus positive   Antibiotics Anti-infectives (From admission, onward)   Start     Ordered Stop   10/29/19 1000  amoxicillin-clavulanate (AUGMENTIN) 875-125 MG per tablet 1 tablet        10/29/19 0843     10/27/19 1600  Ampicillin-Sulbactam (UNASYN) 3 g in sodium chloride 0.9 % 100 mL IVPB  Status:  Discontinued        10/27/19 0950 10/29/19 0843   10/19/19 1000  remdesivir 100 mg in sodium chloride 0.9 % 100 mL  IVPB       "Followed by" Linked Group Details   10/18/19 0515 10/22/19 1042   10/18/19 0600  remdesivir 200 mg in sodium chloride 0.9% 250 mL IVPB       "Followed by" Linked Group Details   10/18/19 0515 10/18/19 0633   10/18/19 0600  piperacillin-tazobactam (ZOSYN) IVPB 3.375 g  Status:  Discontinued        10/18/19 0520 10/27/19 0950   10/17/19 2330  piperacillin-tazobactam (ZOSYN) IVPB 3.375 g        10/17/19 2329 10/18/19 0125       Discharge Exam: Vitals:   10/28/19 0634 10/28/19 1329 10/28/19 2007 10/29/19 0504  BP: 119/84 106/75 124/85 119/76  Pulse: 72 92 81 75  Resp: (!) 21 16 19 18   Temp: 98.5 F (36.9 C) 98.7 F (37.1 C)  98.6 F (37 C) 98.2 F (36.8 C)  TempSrc:  Oral  Oral  SpO2: 100% 96% 100% 100%  Weight:      Height:        General: A/O x4, No acute respiratory distress, cachectic Eyes: negative scleral hemorrhage, negative anisocoria, negative icterus ENT: Negative Runny nose, negative gingival bleeding, Neck:  Negative scars, masses, torticollis, lymphadenopathy, JVD Lungs: Clear to auscultation bilaterally without wheezes or crackles Cardiovascular: Regular rate and rhythm without murmur gallop or rub normal S1 and S2 Abdomen: Positive abdominal pain around insertion site of drain, nondistended, positive soft, bowel sounds, no rebound, no ascites, no appreciable mass  Discharge Instructions  Discharge Instructions    Call MD for:  redness, tenderness, or signs of infection (pain, swelling, redness, odor or green/yellow discharge around incision site)   Complete by: As directed    Diet - low sodium heart healthy   Complete by: As directed    Discharge instructions   Complete by: As directed    ?   Person Under Monitoring Name: Kendra [name]  Location: [place] [address]   Infection Prevention Recommendations for Individuals Confirmed to have, or Being Evaluated for, 2019 Novel Coronavirus (COVID-19) Infection Who Receive Care at Home  Individuals who are confirmed to have, or are being evaluated for, COVID-19 should follow the prevention steps below until a healthcare provider or local or state health department says they can return to normal activities.  Stay home except to get medical care You should restrict activities outside your home, except for getting medical care. Do not go to work, school, or public areas, and do not use public transportation or taxis.  Call ahead before visiting your doctor Before your medical appointment, call the healthcare provider and tell them that you have, or are being evaluated for, COVID-19 infection. This will help the  healthcare provider's office take steps to keep other people from getting infected. Ask your healthcare provider to call the local or state health department.  Monitor your symptoms Seek prompt medical attention if your illness is worsening (e.g., difficulty breathing). Before going to your medical appointment, call the healthcare provider and tell them that you have, or are being evaluated for, COVID-19 infection. Ask your healthcare provider to call the local or state health department.  Wear a facemask You should wear a facemask that covers your nose and mouth when you are in the same room with other people and when you visit a healthcare provider. People who live with or visit you should also wear a facemask while they are in the same room with you.  Separate yourself from other people  in your home As much as possible, you should stay in a different room from other people in your home. Also, you should use a separate bathroom, if available.  Avoid sharing household items You should not share dishes, drinking glasses, cups, eating utensils, towels, bedding, or other items with other people in your home. After using these items, you should wash them thoroughly with soap and water.  Cover your coughs and sneezes Cover your mouth and nose with a tissue when you cough or sneeze, or you can cough or sneeze into your sleeve. Throw used tissues in a lined trash can, and immediately wash your hands with soap and water for at least 20 seconds or use an alcohol-based hand rub.  Wash your [facility] your hands often and thoroughly with soap and water for at least 20 seconds. You can use an alcohol-based hand sanitizer if soap and water are not available and if your hands are not visibly dirty. Avoid touching your eyes, nose, and mouth with unwashed hands.   Prevention Steps for Caregivers and Household Members of Individuals Confirmed to have, or Being Evaluated for, COVID-19 Infection Being  Cared for in the Home  If you live with, or provide care at home for, a person confirmed to have, or being evaluated for, COVID-19 infection please follow these guidelines to prevent infection:  Follow healthcare provider's instructions Make sure that you understand and can help the patient follow any healthcare provider instructions for all care.  Provide for the patient's basic needs You should help the patient with basic needs in the home and provide support for getting groceries, prescriptions, and other personal needs.  Monitor the patient's symptoms If they are getting sicker, call his or her medical provider and tell them that the patient has, or is being evaluated for, COVID-19 infection. This will help the healthcare provider's office take steps to keep other people from getting infected. Ask the healthcare provider to call the local or state health department.  Limit the number of people who have contact with the patient If possible, have only one caregiver for the patient. Other household members should stay in another home or place of residence. If this is not possible, they should stay in another room, or be separated from the patient as much as possible. Use a separate bathroom, if available. Restrict visitors who do not have an essential need to be in the home.  Keep older adults, very young children, and other sick people away from the patient Keep older adults, very young children, and those who have compromised immune systems or chronic health conditions away from the patient. This includes people with chronic heart, lung, or kidney conditions, diabetes, and cancer.  Ensure good ventilation Make sure that shared spaces in the home have good air flow, such as from an air conditioner or an opened window, weather permitting.  Wash your hands often Wash your hands often and thoroughly with soap and water for at least 20 seconds. You can use an alcohol based hand sanitizer  if soap and water are not available and if your hands are not visibly dirty. Avoid touching your eyes, nose, and mouth with unwashed hands. Use disposable paper towels to dry your hands. If not available, use dedicated cloth towels and replace them when they become wet.  Wear a facemask and gloves Wear a disposable facemask at all times in the room and gloves when you touch or have contact with the patient's blood, body fluids, and/or secretions  or excretions, such as sweat, saliva, sputum, nasal mucus, vomit, urine, or feces.  Ensure the mask fits over your nose and mouth tightly, and do not touch it during use. Throw out disposable facemasks and gloves after using them. Do not reuse. Wash your hands immediately after removing your facemask and gloves. If your personal clothing becomes contaminated, carefully remove clothing and launder. Wash your hands after handling contaminated clothing. Place all used disposable facemasks, gloves, and other waste in a lined container before disposing them with other household waste. Remove gloves and wash your hands immediately after handling these items.  Do not share dishes, glasses, or other household items with the patient Avoid sharing household items. You should not share dishes, drinking glasses, cups, eating utensils, towels, bedding, or other items with a patient who is confirmed to have, or being evaluated for, COVID-19 infection. After the person uses these items, you should wash them thoroughly with soap and water.  Wash laundry thoroughly Immediately remove and wash clothes or bedding that have blood, body fluids, and/or secretions or excretions, such as sweat, saliva, sputum, nasal mucus, vomit, urine, or feces, on them. Wear gloves when handling laundry from the patient. Read and follow directions on labels of laundry or clothing items and detergent. In general, wash and dry with the warmest temperatures recommended on the label.  Clean all  areas the individual has used often Clean all touchable surfaces, such as counters, tabletops, doorknobs, bathroom fixtures, toilets, phones, keyboards, tablets, and bedside tables, every day. Also, clean any surfaces that may have blood, body fluids, and/or secretions or excretions on them. Wear gloves when cleaning surfaces the patient has come in contact with. Use a diluted bleach solution (e.g., dilute bleach with 1 part bleach and 10 parts water) or a household disinfectant with a label that says EPA-registered for coronaviruses. To make a bleach solution at home, add 1 tablespoon of bleach to 1 quart (4 cups) of water. For a larger supply, add  cup of bleach to 1 gallon (16 cups) of water. Read labels of cleaning products and follow recommendations provided on product labels. Labels contain instructions for safe and effective use of the cleaning product including precautions you should take when applying the product, such as wearing gloves or eye protection and making sure you have good ventilation during use of the product. Remove gloves and wash hands immediately after cleaning.  Monitor yourself for signs and symptoms of illness Caregivers and household members are considered close contacts, should monitor their health, and will be asked to limit movement outside of the home to the extent possible. Follow the monitoring steps for close contacts listed on the symptom monitoring form.   ? If you have additional questions, contact your local health department or call the epidemiologist on call at [phone] (available 24/7). ? This guidance is subject to change. For the most up-to-date guidance from [facility], please refer to their website: YouBlogs.pl   Discharge wound care:   Complete by: As directed    [place] surgery for wound care instructions   Increase activity slowly   Complete by: As directed       Allergies as of 10/29/2019   No Known Allergies     Medication List    TAKE these medications   acetaminophen 325 MG tablet Commonly known as: TYLENOL Take 2 tablets (650 mg total) by mouth every 6 (six) hours as needed for mild pain or headache (fever >/= 101).   albuterol 108 (90  Base) MCG/ACT inhaler Commonly known as: VENTOLIN HFA Inhale 2 puffs into the lungs every 6 (six) hours as needed for wheezing or shortness of breath.   alum & mag hydroxide-simeth 200-200-20 MG/5ML suspension Commonly known as: MAALOX/MYLANTA Take 30 mLs by mouth every 4 (four) hours as needed for indigestion.   amoxicillin-clavulanate 875-125 MG tablet Commonly known as: AUGMENTIN Take 1 tablet by mouth every 12 (twelve) hours.   folic acid 1 MG tablet Commonly known as: FOLVITE Take 1 tablet (1 mg total) by mouth daily.   lidocaine 5 % Commonly known as: LIDODERM Place 1 patch onto the skin daily. Remove & Discard patch within 12 hours or as directed by MD   melatonin 3 MG Tabs tablet Take 2 tablets (6 mg total) by mouth at bedtime as needed (sleep).   metFORMIN 500 MG tablet Commonly known as: GLUCOPHAGE Take 1 tablet (500 mg total) by mouth 2 (two) times daily with a meal.   ondansetron 4 MG tablet Commonly known as: Zofran Take 1 tablet (4 mg total) by mouth every 8 (eight) hours as needed for nausea or vomiting.   oxyCODONE-acetaminophen 5-325 MG tablet Commonly known as: PERCOCET/ROXICET Take 1-2 tablets by mouth every 4 (four) hours as needed for moderate pain.   pantoprazole 40 MG tablet Commonly known as: Protonix Take 1 tablet (40 mg total) by mouth 2 (two) times daily.   sucralfate 1 GM/10ML suspension Commonly known as: CARAFATE Take 10 mLs (1 g total) by mouth 4 (four) times daily -  with meals and at bedtime.   thiamine 100 MG tablet Take 1 tablet (100 mg total) by mouth daily.            Discharge Care Instructions  (From admission, onward)          Start     Ordered   10/29/19 0000  Discharge wound care:       Comments: [place] surgery for wound care instructions   10/29/19 0902         No Known Allergies  Follow-up Information    Clovis Riley, MD. Go on 11/08/2019.   Specialty: General Surgery Why: 920am. Please arrive to the appointment 30 minutes early for paperwork. Please bring a copy of your photo ID and insurance card to the appointment.  Contact information: [address] [place] [phone]                The results of significant diagnostics from this hospitalization (including imaging, microbiology, ancillary and laboratory) are listed below for reference.    Significant Diagnostic Studies: CT ABDOMEN WO CONTRAST  Result Date: 10/26/2019 CLINICAL DATA:  Assess left upper quadrant drain EXAM: CT ABDOMEN WITHOUT CONTRAST TECHNIQUE: Multidetector CT imaging of the abdomen was performed following the standard protocol without IV contrast. COMPARISON:  10/20/2019 FINDINGS: Lower chest: Slight interval improvement in right basilar atelectasis and/or consolidation. No significant change in a large left pleural effusion and dense consolidation of the left lung base. Hepatobiliary: No focal liver abnormality is seen. No gallstones, gallbladder wall thickening, or biliary dilatation. Pancreas: Unremarkable. No pancreatic ductal dilatation or surrounding inflammatory changes. Spleen: Normal in size without focal abnormality. Adrenals/Urinary Tract: Adrenal glands are unremarkable. Kidneys are normal, without renal calculi, focal lesion, or hydronephrosis. Stomach/Bowel: Status post Roux type gastric bypass. No evidence of bowel wall thickening, distention, or inflammatory changes. Vascular/Lymphatic: No significant vascular findings are present. No enlarged abdominal lymph nodes. Other: No abdominal wall  hernia or abnormality. There has been interval placement of a  percutaneous pigtail drainage catheter in the left upper quadrant, immediately underlying the left hemidiaphragm, within an air and fluid collection, which is significantly reduced in volume compared to prior examination, now flattened and measuring approximately 4.8 x 4.2 x 1.6 cm (series 8, image 22, series 6, image 39). Musculoskeletal: No acute or significant osseous findings. IMPRESSION: 1. There has been interval placement of a percutaneous pigtail drainage catheter in the left upper quadrant, immediately underlying the left hemidiaphragm, within an air and fluid collection, which is significantly reduced in volume compared to prior examination. 2. Slight interval improvement in right basilar atelectasis and/or consolidation. No significant change in a large left pleural effusion and dense consolidation of the left lung base. 3. Status post Roux type gastric bypass. Electronically Signed   By: Eddie Candle M.D.   On: 10/26/2019 19:11   CT ABDOMEN WO CONTRAST  Result Date: 10/20/2019 CLINICAL DATA:  History of gastric bypass 2009. Intraperitoneal free air on CT 10/17/2019. EXAM: CT ABDOMEN WITHOUT CONTRAST TECHNIQUE: Multidetector CT imaging of the abdomen was performed following the standard protocol without IV contrast. Water-soluble oral contrast administered COMPARISON:  CT 10/17/2019 FINDINGS: Lower chest: Dense bibasilar consolidation and small effusions. Increased atelectasis and effusions compared to prior. Hepatobiliary: No focal lesion on noncontrast exam. Gallbladder normal. Pancreas: Normal pancreatic parenchymal intensity. No ductal dilatation or inflammation. Spleen: Normal spleen. Adrenals/urinary tract: Adrenal glands and kidneys are normal. Stomach/Bowel: Post gastric bypass anatomy. Water-soluble contrast is evident in the gastric pouch and proximal jejunum. No evidence of leak at the gastrojejunostomy. No evidence of leak elsewhere of the oral contrast. Some contrast does make it to the  more distal mid small bowel in the upper pelvis. Again demonstrated moderate volume intraperitoneal free air. The volume free air is increased over the beneath the RIGHT hemidiaphragm (image 24/series 4/coronal). There is a fluid collection in the LEFT upper quadrant with an air-fluid level measuring 7.9 x 5.6 cm (image 13/2). This is more defined collection than on comparison exam. Vascular/Lymphatic: Abdominal aortic normal caliber. No retroperitoneal periportal lymphadenopathy. Musculoskeletal: No aggressive osseous lesion IMPRESSION: 1. Increased organization of fluid collection beneath the LEFT hemidiaphragm is concerning for abscess formation. 2. No evidence active leaking of the water-soluble oral contrast at the gastrojejunostomy or elsewhere proximal GI tract. 3. Moderate volume intraperitoneal free air. Increased free air beneath the RIGHT hemidiaphragm. 4. Dense bibasilar consolidation with increasing atelectasis and effusion. These results will be called to the ordering clinician or representative by the Radiologist Assistant, and communication documented in the PACS or [employer]. Electronically Signed   By: Suzy Bouchard M.D.   On: 10/20/2019 12:15   DG Chest 2 View  Result Date: 10/17/2019 CLINICAL DATA:  Epigastric pain EXAM: CHEST - 2 VIEW COMPARISON:  None. FINDINGS: Frontal and lateral views of the chest demonstrate an unremarkable cardiac silhouette. Multifocal bibasilar ground-glass airspace disease could reflect atypical infection such as COVID-19. No effusion or pneumothorax. Surgical clips are seen at the gastroesophageal junction. Lucency beneath the left hemidiaphragm, with gas fluid level noted on lateral view, could reflect a distended stomach. Please correlate for any peritoneal signs that would suggest free gas. If further evaluation is desired, decubitus views of the abdomen or CT of the abdomen could be considered. IMPRESSION: 1. Bibasilar ground-glass airspace disease,  which could reflect multifocal pneumonia versus edema. Pattern is consistent with COVID-19. 2. Nonspecific lucency left upper quadrant, which may reflect a distended gastric  fundus. If there is concern for free intraperitoneal gas, decubitus radiographs or CT could be considered. Critical Value/emergent results were called by telephone at the time of interpretation on 10/17/2019 at 6:36 pm to provider Dr. Tomi Bamberger, who verbally acknowledged these results. Electronically Signed   By: Randa Ngo M.D.   On: 10/17/2019 18:36   DG Abd 1 View  Result Date: 10/18/2019 CLINICAL DATA:  Reported viscus perforation EXAM: ABDOMEN - 1 VIEW COMPARISON:  October 17, 2019 CT abdomen and pelvis FINDINGS: Postoperative changes noted at multiple sites in the abdomen and pelvis. Pneumoperitoneum less well seen on supine examination compared to CT. No appreciable bowel dilatation or air-fluid levels. No bone lesions. 1.7 x 1.3 cm calcification in the left upper abdomen may represent a peripheral arterial aneurysm, unchanged from 1 day prior CT examination. IMPRESSION: Pneumoperitoneum is better seen on recent CT. No bowel obstruction evident. Extensive postoperative change at multiple sites noted. Electronically Signed   By: Lowella Grip III M.D.   On: 10/18/2019 11:37   CT ABDOMEN PELVIS W CONTRAST  Result Date: 10/17/2019 CLINICAL DATA:  Abdominal pain and diarrhea.  Generalized weakness. EXAM: CT ABDOMEN AND PELVIS WITH CONTRAST TECHNIQUE: Multidetector CT imaging of the abdomen and pelvis was performed using the standard protocol following bolus administration of intravenous contrast. CONTRAST:  130mL OMNIPAQUE IOHEXOL 350 MG/ML SOLN COMPARISON:  Chest radiograph earlier this day. FINDINGS: Lower chest: Peripheral ground-glass and consolidative opacities in both lower lobes. Minimal pleural thickening without effusion. Hepatobiliary: No focal liver abnormality is seen. No gallstones, gallbladder wall thickening, or  biliary dilatation. Air tracking about the left lobe of the liver appears to be related to free intra-abdominal air rather than biliary or portal venous gas. Pancreas: No ductal dilatation or inflammation. Spleen: Normal in size without focal abnormality. Adrenals/Urinary Tract: Normal adrenal glands. No hydronephrosis or perinephric edema. Homogeneous renal enhancement with symmetric excretion on delayed phase imaging. Urinary bladder is physiologically distended without wall thickening. Stomach/Bowel: Multiple foci of free air scattered throughout the upper abdomen as well as lower anterior mesentery and lower anterior abdominal cavity. Site of perforation is not definitively delineated. Gastric bypass anatomy. Bowel evaluation is limited in the absence of enteric contrast and paucity of intra-abdominal fat. The excluded gastric remnant is minimally fluid-filled but nondilated. There is minimal wall thickening of the proximal Roux limb. The jejunal anastomosis in this grossly normal. There is no small bowel obstruction or inflammation. Small bowel loop extends into an umbilical hernia but no evidence of bowel wall thickening or proximal dilatation. Pelvic bowel loops are fluid-filled but nondilated. Colon is difficult to define. There is liquid stool in the proximal colon. Normal appendix tentatively visualized. There is transverse colonic tortuosity. The majority of the colon is completely decompressed. Vascular/Lymphatic: Normal caliber abdominal aorta. Patent portal vein. No evidence of portal venous or mesenteric gas. No gross abdominopelvic adenopathy. Reproductive: Retroverted uterus with probable posterior fundal fibroid. No evidence of adnexal mass. Other: Multiple foci of free air throughout the abdomen. Inferior seen throughout the mesentery, anteriorly as well as tracking into the upper abdomen. Small amount of free fluid in the pelvis. There may be an organized collection posterior to the uterus  measuring 4.9 cm, series 2, image 63. Musculoskeletal: Degenerative disc disease at L5-S1. There are no acute or suspicious osseous abnormalities. IMPRESSION: 1. Multiple foci of free air throughout the abdomen consistent with perforated viscus. Site of perforation is not definitively delineated, however there is minimal wall thickening of the proximal Roux  limb. In the setting of gastric bypass, perforated marginal ulcer is considered. There is no other site of bowel inflammation. 2. Small amount of free fluid in the pelvis. There may be an organized collection posterior to the uterus measuring 4.9 cm. 3. Peripheral ground-glass and consolidative opacities in both lower lobes, consistent with COVID pneumonia. Critical Value/emergent results were called by telephone at the time of interpretation on 10/17/2019 at 11:26 pm to Dr Dorie Rank , who verbally acknowledged these results. Electronically Signed   By: Keith Rake M.D.   On: 10/17/2019 23:28   CT IMAGE GUIDED DRAINAGE BY PERCUTANEOUS CATHETER  Result Date: 10/21/2019 INDICATION: Remote history of gastric bypass surgery now with COVID and free air and fluid in the LUQ concerning for abscess vs. perforation. EXAM: CT DRAIN PLACEMENT MEDICATIONS: The patient is currently admitted to the hospital and receiving intravenous antibiotics. The antibiotics were administered within an appropriate time frame prior to the initiation of the procedure. ANESTHESIA/SEDATION: Fentanyl 100 mcg IV; Versed 1 mg IV Moderate Sedation Time: 10 minutes The patient was continuously monitored during the procedure by the interventional radiology nurse under my direct supervision. COMPLICATIONS: None immediate. PROCEDURE: Informed written consent was obtained from the patient after a thorough discussion of the procedural risks, benefits and alternatives. All questions were addressed. Maximal Sterile Barrier Technique was utilized including caps, mask, sterile gowns, sterile gloves,  sterile drape, hand hygiene and skin antiseptic. A timeout was performed prior to the initiation of the procedure. A planning axial CT scan was performed. The fluid and gas collection in the left upper quadrant was successfully identified. A suitable skin entry site was selected and marked. The overlying skin was sterilely prepped and draped in the standard fashion using chlorhexidine skin prep. Local anesthesia was attained by infiltration with 1% lidocaine. A small dermatotomy was made. Under intermittent CT guidance, an 18 gauge trocar needle was carefully advanced into the fluid and gas collection. A 0.035 wire was then advanced colic and coiled. The needle was removed. The skin tract was dilated to 12 [place]. A Cook 12 [place] all-purpose drainage catheter was advanced over the wire and formed in the collection. Aspiration yields at least 120 mL of mildly turbid green-yellow bilious appearing fluid. The appearance is similar to gastric secretions. Follow-up CT imaging demonstrates a well-positioned drainage catheter. The catheter was connected to JP bulb suction and secured to the skin with 0 Prolene suture. Sterile bandages were applied. IMPRESSION: Successful placement of a 12 French drainage catheter into the left upper quadrant fluid and gas collection. The aspirated material appears like gastric secretions. A sample was sent for Gram stain and culture. Signed, Criselda Peaches, MD, [facility] Radiology Electronically Signed   By: Jacqulynn Cadet M.D.   On: 10/21/2019 16:09   [place] EKG SITE RITE  Result Date: 10/20/2019 If Site Rite image not attached, placement could not be confirmed due to current cardiac rhythm.   Microbiology: Recent Results (from the past 240 hour(s))  Aerobic/Anaerobic Culture (surgical/deep wound)     Status: None   Collection Time: 10/21/19  3:11 PM   Specimen: Abscess  Result Value Ref Range Status   Specimen  Description   Final    ABSCESS Performed at [facility] [address], [place], [place] [address]    Special Requests   Final    Normal Performed at [facility], [place] [address] [place], [place] [address]    Gram  Stain   Final    RARE WBC PRESENT, PREDOMINANTLY PMN NO ORGANISMS SEEN    Culture   Final    MODERATE STREPTOCOCCUS ANGINOSIS NO ANAEROBES ISOLATED Performed at [facility], [place] [address] [place], [place] [address]    Report Status 10/25/2019 FINAL  Final     Labs: Basic Metabolic Panel: Recent Labs  Lab 10/24/19 0440 10/25/19 0408 10/25/19 0940 10/26/19 0421 10/27/19 0340 10/28/19 0512 10/29/19 0439  NA  --  137  --  135 134* 136 138  K  --  3.3*  --  3.6 3.8 3.7 3.6  CL  --  98  --  102 100 102 102  CO2  --  28  --  26 27 26 27   GLUCOSE  --  100*  --  102* 96 92 89  BUN  --  <5*  --  <5* <5* <5* <5*  CREATININE  --  0.50  --  0.51 0.56 0.52 0.59  CALCIUM  --  8.2*  --  8.1* 8.2* 8.4* 8.6*  MG   < >  --  1.6* 2.0 1.8 1.9 1.7  PHOS  --   --  2.5 2.6 3.3 3.3 3.2   < > = values in this interval not displayed.   Liver Function Tests: Recent Labs  Lab 10/24/19 0440 10/26/19 0421 10/27/19 0340 10/28/19 0512 10/29/19 0439  AST 20 24 19 18 20   ALT 9 11 9 9 9   ALKPHOS 33* 34* 33* 37* 36*  BILITOT 0.8 0.4 0.3 0.3 0.3  PROT 6.6 7.3 7.4 7.8 7.3  ALBUMIN 2.2* 2.4* 2.3* 2.5* 2.4*   No results for input(s): LIPASE, AMYLASE in the last 168 hours. No results for input(s): AMMONIA in the last 168 hours. CBC: Recent Labs  Lab 10/23/19 0401 10/23/19 1900 10/25/19 0408 10/26/19 0421 10/27/19 0340 10/28/19 0512 10/29/19 0439  WBC 10.4   < > 9.6 9.8 8.8 10.0 8.0  NEUTROABS 8.1*  --   --  6.9 6.2 7.2 4.6  HGB 8.4*   < > 8.6* 8.8* 8.7* 9.0* 8.8*  HCT 27.9*   < > 28.9* 30.0* 29.2* 30.9* 29.7*  MCV 72.8*   < > 69.1* 70.4* 70.5* 71.5* 71.4*  PLT 565*   < > 785* 640* 667* 710* 653*   < > = values in this  interval not displayed.   Cardiac Enzymes: No results for input(s): CKTOTAL, CKMB, CKMBINDEX, TROPONINI in the last 168 hours. BNP: BNP (last 3 results) No results for input(s): BNP in the last 8760 hours.  ProBNP (last 3 results) No results for input(s): PROBNP in the last 8760 hours.  CBG: Recent Labs  Lab 10/28/19 0800 10/28/19 1134 10/28/19 1639 10/28/19 2048 10/29/19 0720  GLUCAP 94 111* 108* 101* 88       Signed:  Dia Crawford, MD [facility] [phone] pager

## 2019-10-29 NOTE — Progress Notes (Signed)
Patient discharged to home

## 2019-10-29 NOTE — Progress Notes (Signed)
PICC line removed per order and line is intact. Vaseline gauze and gauze dressing applied and pressure held. Site clean, dry, and intact. Patient and RN aware that patient is on bedrest for 30 minutes. Patient aware to leave dressing on and dry for 24 hours.  

## 2019-10-29 NOTE — Progress Notes (Signed)
Discharge instructions and drain teaching completed utilizing teach back method. Patient discharging to home when ride available

## 2019-10-29 NOTE — Progress Notes (Signed)
Patient ID: Kendra [name], female   DOB: Jan 29, 1976, 44 y.o.   MRN: [id number] No changes. [place] for d/c from surgical standpoint with drain and oral abx for another 5 days. Will need follow up with drain clinic and bariatric surgeons

## 2019-10-30 ENCOUNTER — Other Ambulatory Visit: Payer: Self-pay | Admitting: Surgery

## 2019-10-30 DIAGNOSIS — R198 Other specified symptoms and signs involving the digestive system and abdomen: Secondary | ICD-10-CM

## 2019-10-30 DIAGNOSIS — L0291 Cutaneous abscess, unspecified: Secondary | ICD-10-CM

## 2019-11-04 ENCOUNTER — Emergency Department (HOSPITAL_COMMUNITY): Payer: Medicaid - Out of State

## 2019-11-04 ENCOUNTER — Emergency Department (HOSPITAL_COMMUNITY)
Admission: EM | Admit: 2019-11-04 | Discharge: 2019-11-04 | Disposition: A | Discharge status: Home / Self Care | Payer: Medicaid - Out of State | Attending: Emergency Medicine | Admitting: Emergency Medicine

## 2019-11-04 ENCOUNTER — Encounter (HOSPITAL_COMMUNITY): Payer: Self-pay

## 2019-11-04 ENCOUNTER — Other Ambulatory Visit: Payer: Self-pay

## 2019-11-04 DIAGNOSIS — K255 Chronic or unspecified gastric ulcer with perforation: Secondary | ICD-10-CM

## 2019-11-04 DIAGNOSIS — U071 COVID-19: Secondary | ICD-10-CM | POA: Diagnosis not present

## 2019-11-04 DIAGNOSIS — Z7951 Long term (current) use of inhaled steroids: Secondary | ICD-10-CM | POA: Diagnosis not present

## 2019-11-04 DIAGNOSIS — R1012 Left upper quadrant pain: Secondary | ICD-10-CM

## 2019-11-04 DIAGNOSIS — J45909 Unspecified asthma, uncomplicated: Secondary | ICD-10-CM | POA: Insufficient documentation

## 2019-11-04 DIAGNOSIS — E119 Type 2 diabetes mellitus without complications: Secondary | ICD-10-CM | POA: Diagnosis not present

## 2019-11-04 DIAGNOSIS — K529 Noninfective gastroenteritis and colitis, unspecified: Secondary | ICD-10-CM | POA: Diagnosis present

## 2019-11-04 DIAGNOSIS — K251 Acute gastric ulcer with perforation: Secondary | ICD-10-CM | POA: Diagnosis not present

## 2019-11-04 DIAGNOSIS — K9181 Other intraoperative complications of digestive system: Secondary | ICD-10-CM | POA: Diagnosis not present

## 2019-11-04 HISTORY — DX: Unspecified asthma, uncomplicated: J45.909

## 2019-11-04 HISTORY — DX: Type 2 diabetes mellitus without complications: E11.9

## 2019-11-04 LAB — COMPREHENSIVE METABOLIC PANEL
ALT: 14 U/L (ref 0–44)
AST: 37 U/L (ref 15–41)
Albumin: 2.3 g/dL — ABNORMAL LOW (ref 3.5–5.0)
Alkaline Phosphatase: 28 U/L — ABNORMAL LOW (ref 38–126)
Anion gap: 7 (ref 5–15)
BUN: 6 mg/dL (ref 6–20)
CO2: 20 mmol/L — ABNORMAL LOW (ref 22–32)
Calcium: 6.9 mg/dL — ABNORMAL LOW (ref 8.9–10.3)
Chloride: 113 mmol/L — ABNORMAL HIGH (ref 98–111)
Creatinine, Ser: 0.41 mg/dL — ABNORMAL LOW (ref 0.44–1.00)
GFR calc Af Amer: >60 mL/min (ref >60)
GFR calc non Af Amer: >60 mL/min (ref >60)
Glucose, Bld: 77 mg/dL (ref 70–99)
Potassium: 3.9 mmol/L (ref 3.5–5.1)
Sodium: 140 mmol/L (ref 135–145)
Total Bilirubin: 0.6 mg/dL (ref 0.3–1.2)
Total Protein: 6.1 g/dL — ABNORMAL LOW (ref 6.5–8.1)

## 2019-11-04 LAB — CBC WITH DIFFERENTIAL/PLATELET
Abs Immature Granulocytes: 0.02 10*3/uL (ref 0.00–0.07)
Basophils Absolute: 0.1 10*3/uL (ref 0.0–0.1)
Basophils Relative: 2 %
Eosinophils Absolute: 0.3 10*3/uL (ref 0.0–0.5)
Eosinophils Relative: 6 %
HCT: 29.7 % — ABNORMAL LOW (ref 36.0–46.0)
Hemoglobin: 9 g/dL — ABNORMAL LOW (ref 12.0–15.0)
Immature Granulocytes: 0 %
Lymphocytes Relative: 35 %
Lymphs Abs: 1.9 10*3/uL (ref 0.7–4.0)
MCH: 22 pg — ABNORMAL LOW (ref 26.0–34.0)
MCHC: 30.3 g/dL (ref 30.0–36.0)
MCV: 72.6 fL — ABNORMAL LOW (ref 80.0–100.0)
Monocytes Absolute: 0.4 10*3/uL (ref 0.1–1.0)
Monocytes Relative: 8 %
Neutro Abs: 2.7 10*3/uL (ref 1.7–7.7)
Neutrophils Relative %: 49 %
Platelets: 521 10*3/uL — ABNORMAL HIGH (ref 150–400)
RBC: 4.09 MIL/uL (ref 3.87–5.11)
RDW: 30.3 % — ABNORMAL HIGH (ref 11.5–15.5)
WBC: 5.4 10*3/uL (ref 4.0–10.5)
nRBC: 0 % (ref 0.0–0.2)

## 2019-11-04 LAB — LIPASE, BLOOD: Lipase: 31 U/L (ref 11–51)

## 2019-11-04 MED ORDER — ONDANSETRON HCL 4 MG/2ML IJ SOLN
4.0000 mg | Freq: Once | INTRAMUSCULAR | Status: AC
  Administered 2019-11-04: 4 mg via INTRAVENOUS
  Filled 2019-11-04: qty 2

## 2019-11-04 MED ORDER — SODIUM CHLORIDE 0.9 % IV BOLUS
1000.0000 mL | Freq: Once | INTRAVENOUS | Status: AC
  Administered 2019-11-04: 1000 mL via INTRAVENOUS

## 2019-11-04 MED ORDER — SODIUM CHLORIDE (PF) 0.9 % IJ SOLN
INTRAMUSCULAR | Status: AC
  Filled 2019-11-04: qty 50

## 2019-11-04 MED ORDER — MORPHINE SULFATE (PF) 4 MG/ML IV SOLN
4.0000 mg | Freq: Once | INTRAVENOUS | Status: AC
  Administered 2019-11-04: 4 mg via INTRAVENOUS
  Filled 2019-11-04: qty 1

## 2019-11-04 MED ORDER — HYDROCODONE-ACETAMINOPHEN 5-325 MG PO TABS
1.0000 | ORAL_TABLET | Freq: Four times a day (QID) | ORAL | 0 refills | Status: AC | PRN
Start: 2019-11-04 — End: ?

## 2019-11-04 MED ORDER — IOHEXOL 300 MG/ML  SOLN
100.0000 mL | Freq: Once | INTRAMUSCULAR | Status: AC | PRN
  Administered 2019-11-04: 100 mL via INTRAVENOUS

## 2019-11-04 NOTE — ED Notes (Signed)
Pt admitted for covid 1 week ago and had drain placed for ulcer. Pt c/o new onset pain at drain site starting yesterday evening. Denies tenderness and fever. Pain increased with breathing and movement. Took 2 tylenol last night.

## 2019-11-04 NOTE — ED Provider Notes (Signed)
[facility] DEPT Provider Note   CSN: [id number] Arrival date & time: 11/04/19  1004     History Chief Complaint  Patient presents with  . jp drain pain    Kendra [name] is a 44 y.o. female.  Patient is a 44 year old female with past medical history of alcohol abuse, asthma, prior gastric bypass surgery.  Patient recently admitted for perforation of gastric ulcer.  Patient developed an abscess and a drain was placed.  She currently has a JP drain entering the left upper quadrant.  She presents today with complaints of pain in her abdomen underneath the JP drain site.  She denies any fevers or chills.  She denies any injury or trauma.  Patient was discharged with a prescription for Augmentin, however this was not filled for 5 days due to insurance demographics.  The history is provided by the patient.       Past Medical History:  Diagnosis Date  . Asthma   . Diabetes mellitus without complication [facility])     Patient Active Problem List   Diagnosis Date Noted  . ETOH abuse 10/28/2019  . AKI (acute kidney injury) ([place]) 10/22/2019  . Hyperglycemia 10/22/2019  . Perforated viscus 10/18/2019  . 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 SIRS (systemic inflammatory response syndrome) ([place]) 10/18/2019  . Pneumonia due to COVID-19 virus 10/18/2019  . Anemia 10/18/2019    Past Surgical History:  Procedure Laterality Date  . CESAREAN SECTION    . GASTRIC BYPASS    . GASTRIC BYPASS  2009     OB History   No obstetric history on file.     History reviewed. No pertinent family history.  Social History   Tobacco Use  . Smoking status: Never Smoker  . Smokeless tobacco: Never Used  Substance Use Topics  . Alcohol use: Not Currently  . Drug use: Yes    Types: Marijuana    Home Medications Prior to Admission medications   Medication Sig Start Date End Date Taking? Authorizing Provider  acetaminophen (TYLENOL) 325 MG tablet Take 2 tablets  (650 mg total) by mouth every 6 (six) hours as needed for mild pain or headache (fever >/= 101). 10/29/19   Allie Bossier, MD  albuterol (VENTOLIN HFA) 108 (90 Base) MCG/ACT inhaler Inhale 2 puffs into the lungs every 6 (six) hours as needed for wheezing or shortness of breath. 10/29/19   Allie Bossier, MD  alum & mag hydroxide-simeth (MAALOX/MYLANTA) 200-200-20 MG/5ML suspension Take 30 mLs by mouth every 4 (four) hours as needed for indigestion. 10/29/19   Allie Bossier, MD  amoxicillin-clavulanate (AUGMENTIN) 875-125 MG tablet Take 1 tablet by mouth every 12 (twelve) hours. 10/29/19   Allie Bossier, MD  folic acid (FOLVITE) 1 MG tablet Take 1 tablet (1 mg total) by mouth daily. 10/29/19   Allie Bossier, MD  lidocaine (LIDODERM) 5 % Place 1 patch onto the skin daily. Remove & Discard patch within 12 hours or as directed by MD 10/29/19   Allie Bossier, MD  melatonin 3 MG TABS tablet Take 2 tablets (6 mg total) by mouth at bedtime as needed (sleep). 10/29/19   Allie Bossier, MD  metFORMIN (GLUCOPHAGE) 500 MG tablet Take 1 tablet (500 mg total) by mouth 2 (two) times daily with a meal. 10/29/19   Allie Bossier, MD  ondansetron (ZOFRAN) 4 MG tablet Take 1 tablet (4 mg total) by mouth every 8 (eight) hours as needed for nausea or  vomiting. 10/29/19   Allie Bossier, MD  oxyCODONE-acetaminophen (PERCOCET/ROXICET) 5-325 MG tablet Take 1-2 tablets by mouth every 4 (four) hours as needed for moderate pain. 10/29/19   Allie Bossier, MD  pantoprazole (PROTONIX) 40 MG tablet Take 1 tablet (40 mg total) by mouth 2 (two) times daily. 10/29/19 10/28/20  Allie Bossier, MD  sucralfate (CARAFATE) 1 GM/10ML suspension Take 10 mLs (1 g total) by mouth 4 (four) times daily -  with meals and at bedtime. 10/29/19   Allie Bossier, MD  thiamine 100 MG tablet Take 1 tablet (100 mg total) by mouth daily. 10/29/19   Allie Bossier, MD    Allergies    Patient has no known allergies.  Review of Systems   Review of  Systems  All other systems reviewed and are negative.   Physical Exam Updated Vital Signs BP (!) 121/91 (BP Location: Left Arm)   Pulse 85   Temp 98.9 F (37.2 C) (Oral)   Resp 12   Ht 5\' 5"  (1.651 m)   Wt 55.3 kg   LMP 10/10/2019   SpO2 98%   BMI 20.29 kg/m   Physical Exam Vitals and nursing note reviewed.  Constitutional:      General: She is not in acute distress.    Appearance: She is well-developed. She is not diaphoretic.  HENT:     Head: Normocephalic and atraumatic.  Cardiovascular:     Rate and Rhythm: Normal rate and regular rhythm.     Heart sounds: No murmur heard.  No friction rub. No gallop.   Pulmonary:     Effort: Pulmonary effort is normal. No respiratory distress.     Breath sounds: Normal breath sounds. No wheezing.  Abdominal:     General: Bowel sounds are normal. There is no distension.     Palpations: Abdomen is soft.     Tenderness: There is abdominal tenderness. There is no guarding or rebound.     Comments: There is tenderness to palpation in the left upper quadrant and periumbilical region.  The JP drain site in the left upper quadrant appears clean and well maintained.  There is no surrounding erythema or purulent drainage either within the drain bulb or from the insertion site.  Musculoskeletal:        General: Normal range of motion.     Cervical back: Normal range of motion and neck supple.  Skin:    General: Skin is warm and dry.  Neurological:     Mental Status: She is alert and oriented to person, place, and time.     ED Results / Procedures / Treatments   Labs (all labs ordered are listed, but only abnormal results are displayed) Labs Reviewed  COMPREHENSIVE METABOLIC PANEL  LIPASE, BLOOD  CBC WITH DIFFERENTIAL/PLATELET    EKG None  Radiology No results found.  Procedures Procedures (including critical care time)  Medications Ordered in ED Medications  sodium chloride 0.9 % bolus 1,000 mL (has no administration in  time range)  ondansetron (ZOFRAN) injection 4 mg (has no administration in time range)  morphine 4 MG/ML injection 4 mg (has no administration in time range)    ED Course  I have reviewed the triage vital signs and the nursing notes.  Pertinent labs & imaging results that were available during my care of the patient were reviewed by me and considered in my medical decision making (see chart for details).    MDM Rules/Calculators/A&P  Patient is a 44 year old  female with history of gastric bypass and recent hospitalization for perforated ulcer. Patient underwent surgery and JP drain placement. She presents today with complaints of pain underlying the JP drain site. This started yesterday.  Patient is from [place] and was unable to fill her antibiotic prescription locally so has not been taking it for the past 5 days. She is afebrile with no white count and today's CT scan shows a decrease in the size of the abscess. She also has partially improved consolidation in the right and left lung bases.  CT scan shows no definitive cause of the discomfort she is describing. Her pain was treated here in the ER and she seems to feel better. No emergent condition has been identified and I feel as though she is appropriate for discharge. She will be given additional pain medication and is to follow-up with her surgeon in the next few days.  Final Clinical Impression(s) / ED Diagnoses Final diagnoses:  None    Rx / DC Orders ED Discharge Orders    None       Veryl Speak, MD 11/04/19 1445

## 2019-11-04 NOTE — ED Triage Notes (Signed)
Pt d/c 6 days ago after covid and ulcer. Had drain placed left lower chest. Pt c/o pain near the drain entry that started yesterday evening. Pain increased with breathing, not relieved with position changes. Pt denies tenderness and fever. Took 2 tylenol yesterday evening. Hx gastric bypass.   98.9

## 2019-11-04 NOTE — Discharge Instructions (Addendum)
Begin taking hydrocodone as prescribed as needed for pain.  Return to the emergency department if you develop high fever, worsening pain, bloody stool, or other new and concerning symptoms.  You should follow-up with your surgeon in the next 3 to 4 days for a recheck.

## 2019-11-14 ENCOUNTER — Encounter: Payer: Self-pay | Admitting: Radiology

## 2019-11-14 ENCOUNTER — Ambulatory Visit
Admission: RE | Admit: 2019-11-14 | Discharge: 2019-11-14 | Disposition: A | Discharge status: Home / Self Care | Payer: Medicaid - Out of State | Source: Ambulatory Visit | Attending: Surgery | Admitting: Surgery

## 2019-11-14 ENCOUNTER — Ambulatory Visit
Admission: RE | Admit: 2019-11-14 | Discharge: 2019-11-14 | Disposition: A | Discharge status: Home / Self Care | Payer: Medicaid - Out of State | Source: Ambulatory Visit | Attending: Radiology | Admitting: Radiology

## 2019-11-14 DIAGNOSIS — L0291 Cutaneous abscess, unspecified: Secondary | ICD-10-CM

## 2019-11-14 DIAGNOSIS — R198 Other specified symptoms and signs involving the digestive system and abdomen: Secondary | ICD-10-CM

## 2019-11-14 HISTORY — PX: IR RADIOLOGIST EVAL & MGMT: IMG5224

## 2019-11-14 NOTE — Progress Notes (Signed)
Referring Physician(s): Dr Dr Rich Brave  Chief Complaint: The patient is seen in follow up today s/p Successful placement of a 12 French drainage catheter into the left upper quadrant fluid and gas collection on 10/21/19  History of present illness:  Pt with Hx gastric bypass surgery 2009 Hx known gastric ulcer-- admitted noncompliance with meds ETOH abuse-- recent stopped drinking  LUQ abscess vs perforation  Drain placed in IR 10/21/19 Subsequent CT scans 9/16 and 9/25--- both revealing continued improvement - resolution of abscess  Scheduled today for drain injection ad evaluation  Denies fever or chills Denies N/V Pain only at site Flushes daily ~5 cc OP minimal at most daily Still connected to [place]    Past Medical History:  Diagnosis Date  . Asthma   . Diabetes mellitus without complication [facility])     Past Surgical History:  Procedure Laterality Date  . CESAREAN SECTION    . GASTRIC BYPASS    . GASTRIC BYPASS  2009  . IR RADIOLOGIST EVAL & MGMT  11/14/2019    Allergies: Patient has no known allergies.  Medications: Prior to Admission medications   Medication Sig Start Date End Date Taking? Authorizing Provider  acetaminophen (TYLENOL) 325 MG tablet Take 2 tablets (650 mg total) by mouth every 6 (six) hours as needed for mild pain or headache (fever >/= 101). 10/29/19   Allie Bossier, MD  albuterol (VENTOLIN HFA) 108 (90 Base) MCG/ACT inhaler Inhale 2 puffs into the lungs every 6 (six) hours as needed for wheezing or shortness of breath. 10/29/19   Allie Bossier, MD  alum & mag hydroxide-simeth (MAALOX/MYLANTA) 200-200-20 MG/5ML suspension Take 30 mLs by mouth every 4 (four) hours as needed for indigestion. 10/29/19   Allie Bossier, MD  amoxicillin-clavulanate (AUGMENTIN) 875-125 MG tablet Take 1 tablet by mouth every 12 (twelve) hours. 10/29/19   Allie Bossier, MD  folic acid (FOLVITE) 1 MG tablet Take 1 tablet (1 mg total) by mouth daily. 10/29/19   Allie Bossier, MD  HYDROcodone-acetaminophen (NORCO) 5-325 MG tablet Take 1-2 tablets by mouth every 6 (six) hours as needed. 11/04/19   Veryl Speak, MD  lidocaine (LIDODERM) 5 % Place 1 patch onto the skin daily. Remove & Discard patch within 12 hours or as directed by MD 10/29/19   Allie Bossier, MD  melatonin 3 MG TABS tablet Take 2 tablets (6 mg total) by mouth at bedtime as needed (sleep). 10/29/19   Allie Bossier, MD  metFORMIN (GLUCOPHAGE) 500 MG tablet Take 1 tablet (500 mg total) by mouth 2 (two) times daily with a meal. 10/29/19   Allie Bossier, MD  ondansetron (ZOFRAN) 4 MG tablet Take 1 tablet (4 mg total) by mouth every 8 (eight) hours as needed for nausea or vomiting. 10/29/19   Allie Bossier, MD  oxyCODONE-acetaminophen (PERCOCET/ROXICET) 5-325 MG tablet Take 1-2 tablets by mouth every 4 (four) hours as needed for moderate pain. 10/29/19   Allie Bossier, MD  pantoprazole (PROTONIX) 40 MG tablet Take 1 tablet (40 mg total) by mouth 2 (two) times daily. 10/29/19 10/28/20  Allie Bossier, MD  sucralfate (CARAFATE) 1 GM/10ML suspension Take 10 mLs (1 g total) by mouth 4 (four) times daily -  with meals and at bedtime. 10/29/19   Allie Bossier, MD  thiamine 100 MG tablet Take 1 tablet (100 mg total) by mouth daily. 10/29/19   Allie Bossier, MD     No family history  on file.  Social History   Socioeconomic History  . Marital status: Single    Spouse name: Not on file  . Number of children: Not on file  . Years of education: Not on file  . Highest education level: Not on file  Occupational History  . Not on file  Tobacco Use  . Smoking status: Never Smoker  . Smokeless tobacco: Never Used  Substance and Sexual Activity  . Alcohol use: Not Currently  . Drug use: Yes    Types: Marijuana  . Sexual activity: Not on file  Other Topics Concern  . Not on file  Social History Narrative  . Not on file   Social Determinants of Health   Financial Resource Strain:   . Difficulty  of Paying Living Expenses: Not on file  Food Insecurity:   . Worried About Charity fundraiser in the Last Year: Not on file  . Ran Out of Food in the Last Year: Not on file  Transportation Needs:   . Lack of Transportation (Medical): Not on file  . Lack of Transportation (Non-Medical): Not on file  Physical Activity:   . Days of Exercise per Week: Not on file  . Minutes of Exercise per Session: Not on file  Stress:   . Feeling of Stress : Not on file  Social Connections:   . Frequency of Communication with Friends and Family: Not on file  . Frequency of Social Gatherings with Friends and Family: Not on file  . Attends Religious Services: Not on file  . Active Member of Clubs or Organizations: Not on file  . Attends Archivist Meetings: Not on file  . Marital Status: Not on file     Vital Signs: LMP 10/16/2019 (Exact Date)   Physical Exam Skin:    General: Skin is warm.     Comments: Site is clean and dry NT no bleeding No sign of infection Drain injection today shows no fistula and no collection per Dr Earleen Newport  Removal per his order  Removed without complication Dressing applied      Imaging: IR Radiologist Eval & Mgmt  Result Date: 11/14/2019 Please refer to notes tab for details about interventional procedure. (Op Note)   Labs:  CBC: Recent Labs    10/27/19 0340 10/28/19 0512 10/29/19 0439 11/04/19 1202  WBC 8.8 10.0 8.0 5.4  HGB 8.7* 9.0* 8.8* 9.0*  HCT 29.2* 30.9* 29.7* 29.7*  PLT 667* 710* 653* 521*    COAGS: Recent Labs    10/18/19 0040  INR 1.4*    BMP: Recent Labs    10/27/19 0340 10/28/19 0512 10/29/19 0439 11/04/19 1202  NA 134* 136 138 140  K 3.8 3.7 3.6 3.9  CL 100 102 102 113*  CO2 27 26 27  20*  GLUCOSE 96 92 89 77  BUN <5* <5* <5* 6  CALCIUM 8.2* 8.4* 8.6* 6.9*  CREATININE 0.56 0.52 0.59 0.41*  GFRNONAA >60 >60 >60 >60  GFRAA >60 >60 >60 >60    LIVER FUNCTION TESTS: Recent Labs    10/27/19 0340  10/28/19 0512 10/29/19 0439 11/04/19 1202  BILITOT 0.3 0.3 0.3 0.6  AST 19 18 20  37  ALT 9 9 9 14   ALKPHOS 33* 37* 36* 28*  PROT 7.4 7.8 7.3 6.1*  ALBUMIN 2.3* 2.5* 2.4* 2.3*    Assessment:  LUQ abscess drain placed 10/21/19 CT x 2 have shown improvement/resolution of abscess OP is minimal to none Injection today reveals no collection or  fistula Drain removed without complication Follow with [facility]-- Dr Kae Heller Pt is agreeable to plan  Signed: Lavonia Drafts, PA-C 11/14/2019, 10:59 AM   Please refer to Dr. Earleen Newport attestation of this note for management and plan.

## 2021-05-02 IMAGING — CT CT ABD-PELV W/ CM
2 of 5 series · 15 of 46 positions shown, 17 images · IV contrast (OMNIPAQUE 300)
Comparison: [DATE]

CLINICAL DATA: Left upper quadrant abdominal abscess, post
CT-guided percutaneous drainage placement on [REDACTED]. The
patient started having pain at the drain site last night.

EXAM:
CT ABDOMEN AND PELVIS WITH CONTRAST
TECHNIQUE: Multidetector CT imaging of the abdomen and pelvis was performed
using the standard protocol following bolus administration of
intravenous contrast.
CONTRAST:  100mL OMNIPAQUE IOHEXOL 300 MG/ML  SOLN

[Series 2: axial st · axial · 0.65mm/px · z∈[-637,-277]mm · 12 of 86 slices shown, 14 images]
[im 7/86  soft-tissue]
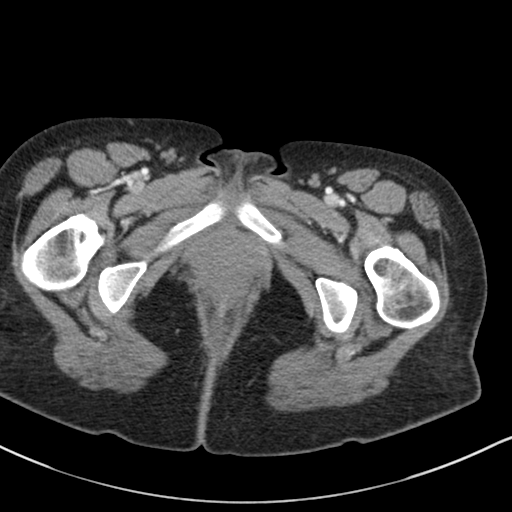
[im 7/86  bone]
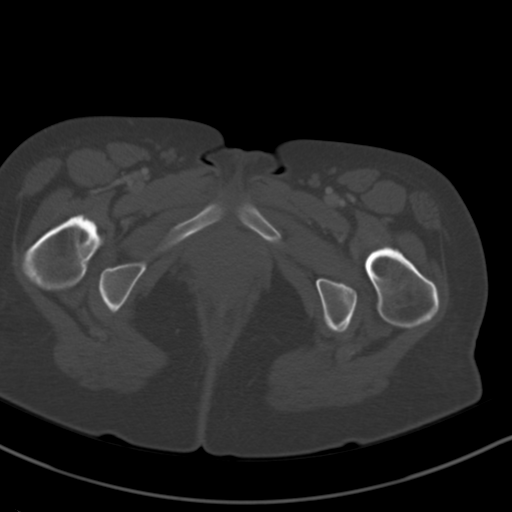
[im 14/86  soft-tissue]
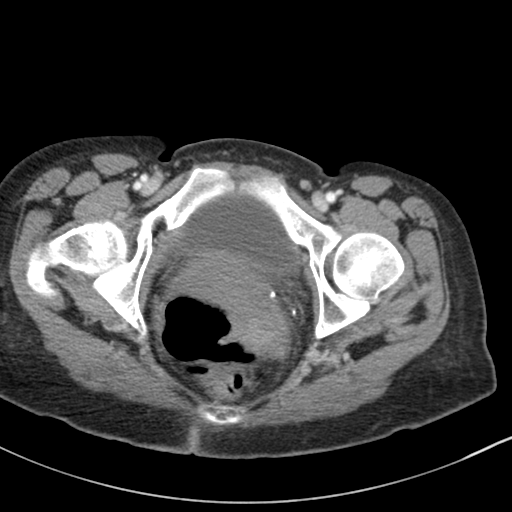
[im 20/86  soft-tissue]
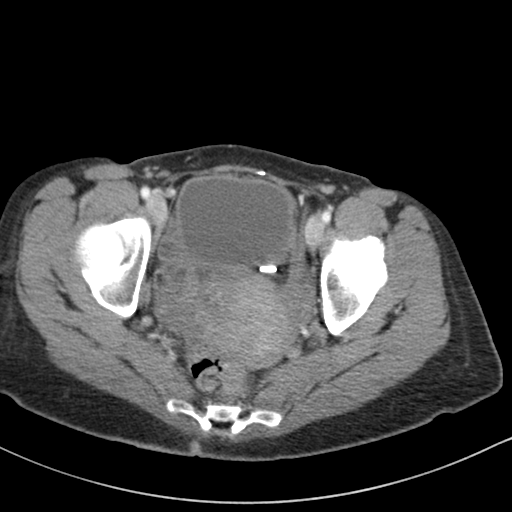
[im 27/86  soft-tissue]
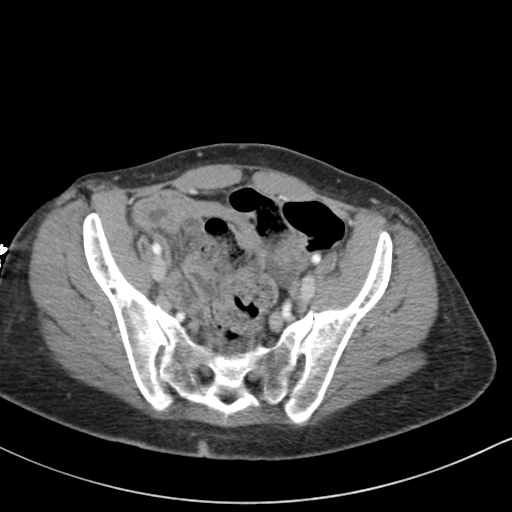
[im 33/86  soft-tissue]
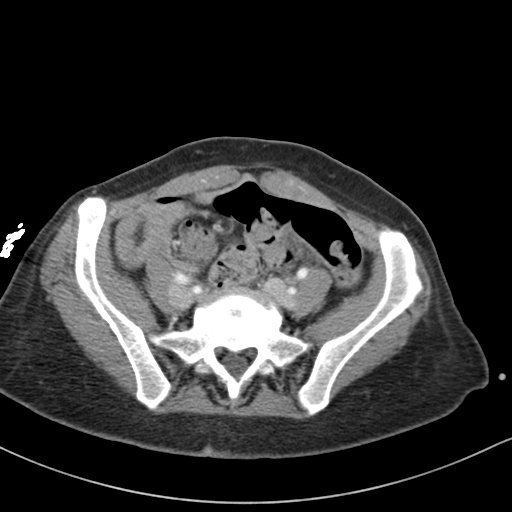
[im 40/86  soft-tissue]
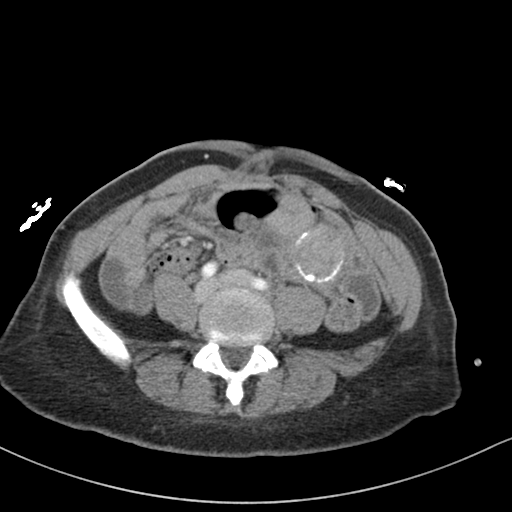
[im 46/86  soft-tissue]
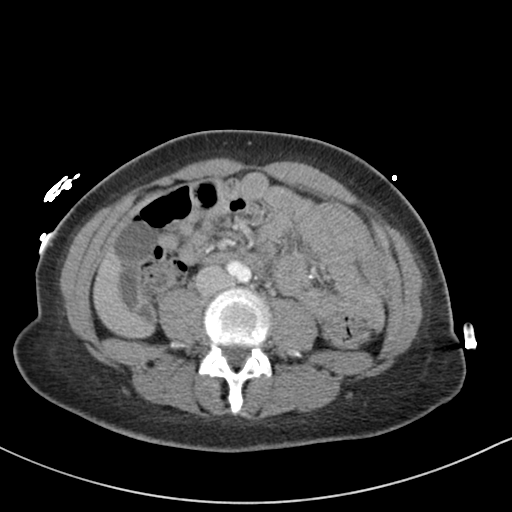
[im 53/86  soft-tissue]
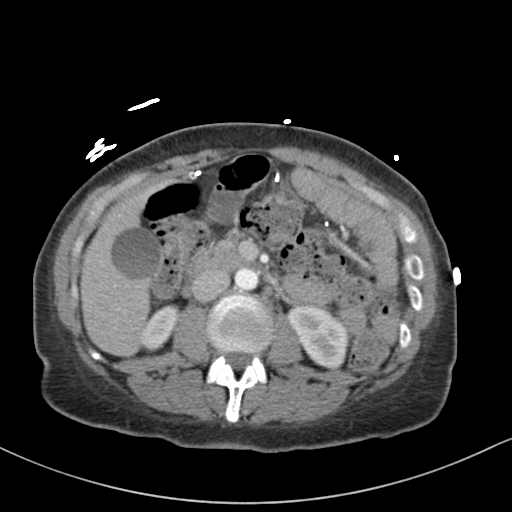
[im 59/86  soft-tissue]
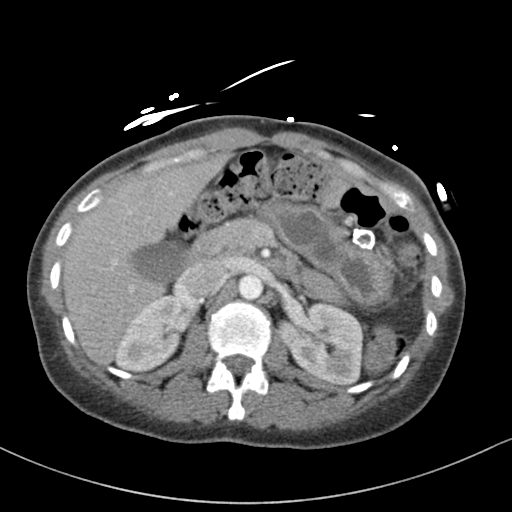
[im 59/86  bone]
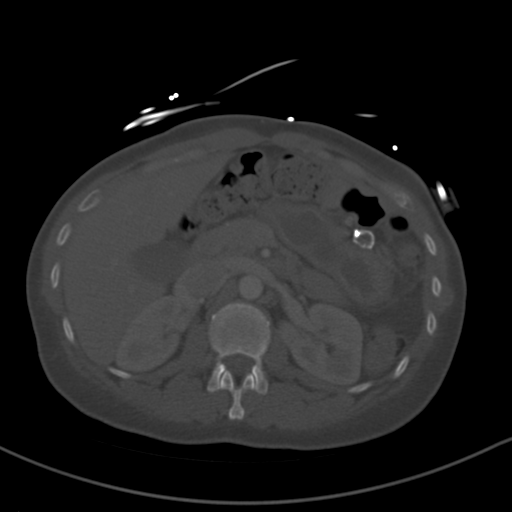
[im 66/86  soft-tissue]
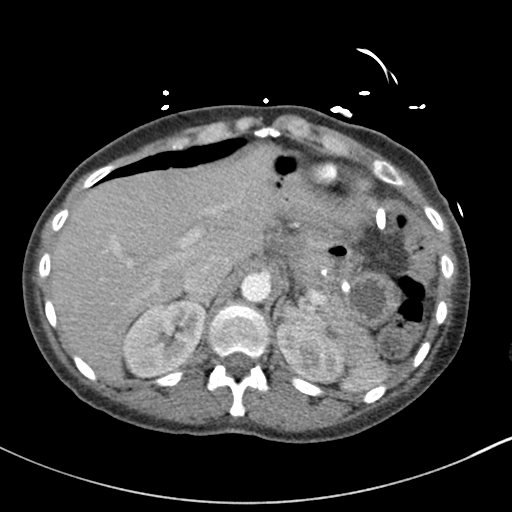
[im 72/86  soft-tissue]
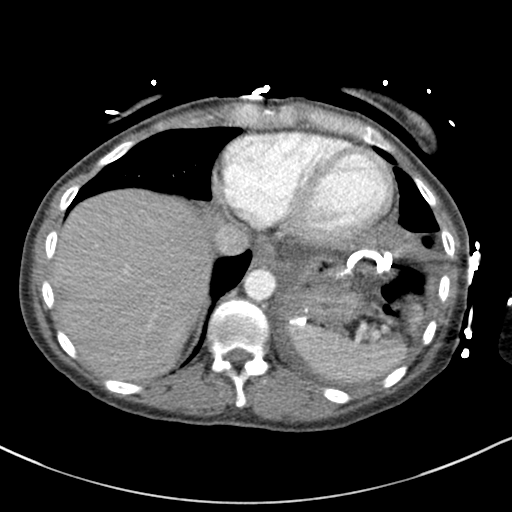
[im 79/86  soft-tissue]
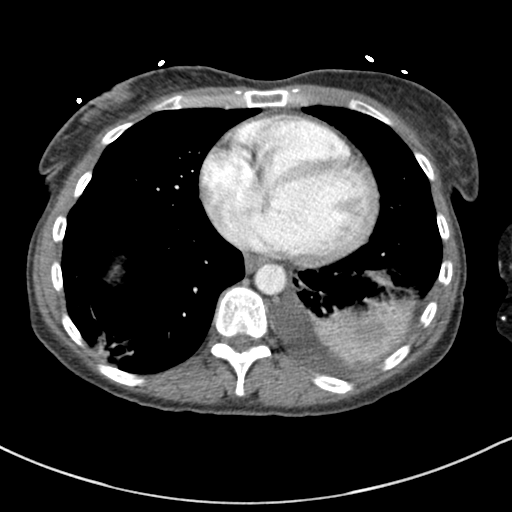

[Series 5: coronal st · coronal · 0.56mm/px · 3 of 127 slices shown]
[im 43/127  soft-tissue]
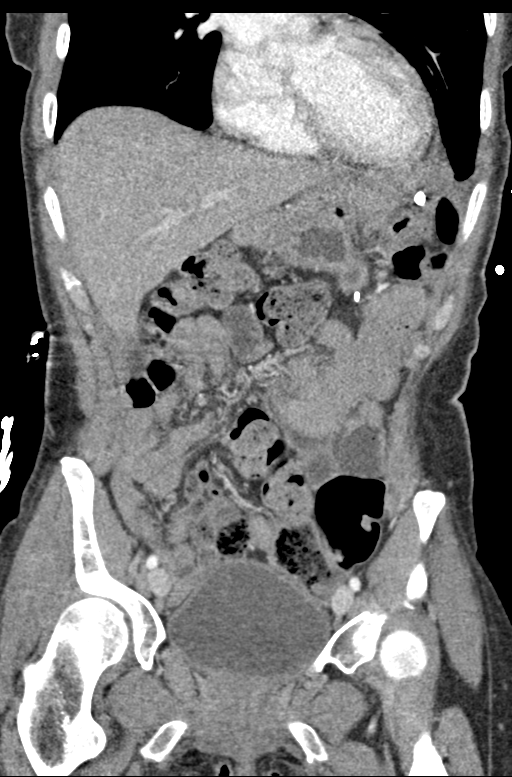
[im 57/127  soft-tissue]
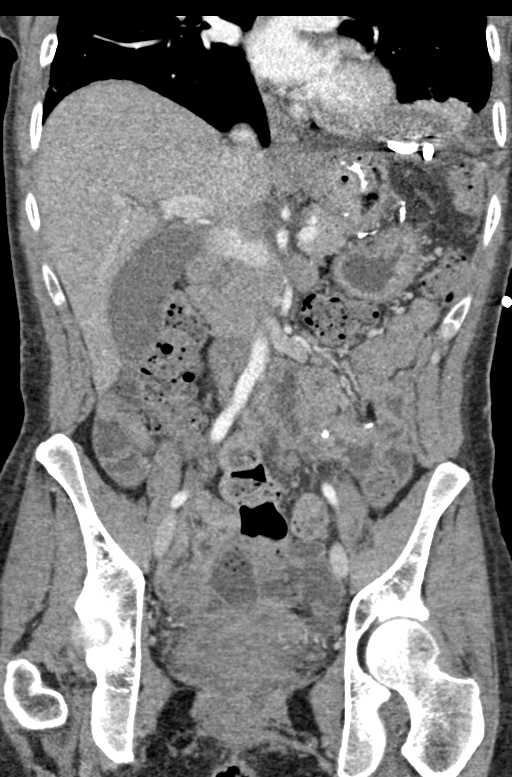
[im 71/127  soft-tissue]
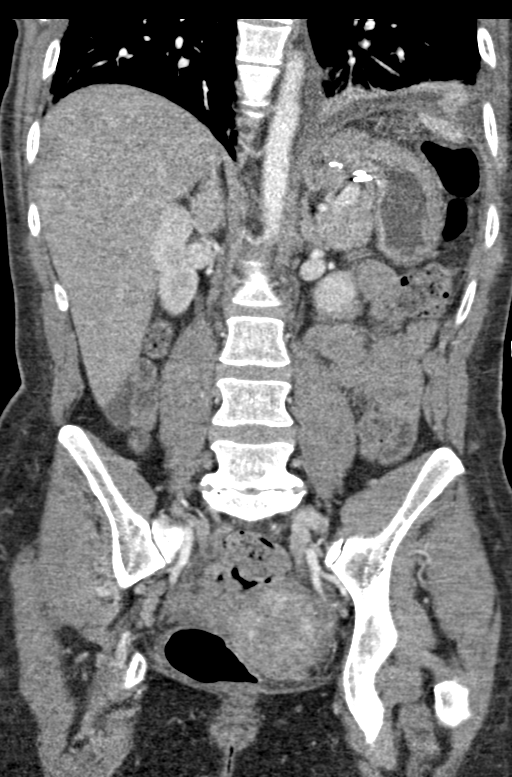

[15 of 46 positions shown; findings below may reference images not displayed]

FINDINGS: Lower chest: Partially improved consolidation in the right and left
lung base. Residual left pleural effusion.

Hepatobiliary: No focal liver abnormality is seen. No gallstones,
gallbladder wall thickening, or biliary dilatation.

Pancreas: Unremarkable. No pancreatic ductal dilatation or
surrounding inflammatory changes.

Spleen: Normal in size without focal abnormality.

Adrenals/Urinary Tract: Adrenal glands are unremarkable. Kidneys are
normal, without renal calculi, focal lesion, or hydronephrosis.
Bladder is unremarkable.

Stomach/Bowel: Status post gastric bypass. No evidence of
small-bowel obstruction. No evidence of inflammatory changes.

Vascular/Lymphatic: No significant vascular findings are present. No
enlarged abdominal or pelvic lymph nodes.

Reproductive: Uterus and bilateral adnexa are unremarkable.

Other: Percutaneous intra-abdominal drainage catheter from left
upper quadrant approach is stable in appearance. There is further
reduction in the size of gas and fluid collection at the catheter
tip in the left upper quadrant of the abdomen. Its size is now
difficult to measure.

Musculoskeletal: No acute or significant osseous findings.
IMPRESSION: 1. Further reduction in the size of the gas and fluid collection at
the catheter tip in the left upper quadrant of the abdomen. Its size
is now difficult to measure.
2. Partially improved consolidation in the right and left lung
bases. Residual left pleural effusion.
